# Patient Record
Sex: Male | Born: 1985 | Race: Black or African American | Hispanic: No | Marital: Single | Smoking: Former smoker
Health system: Southern US, Community
[De-identification: ages and names within clinical notes are randomized; demographics above are authoritative.]

---

## 2008-03-20 ENCOUNTER — Emergency Department (HOSPITAL_COMMUNITY): Admission: EM | Admit: 2008-03-20 | Discharge: 2008-03-20 | Payer: Self-pay | Admitting: Emergency Medicine

## 2008-11-17 ENCOUNTER — Emergency Department (HOSPITAL_COMMUNITY): Admission: EM | Admit: 2008-11-17 | Discharge: 2008-11-17 | Payer: Self-pay | Admitting: Emergency Medicine

## 2012-02-20 ENCOUNTER — Emergency Department (HOSPITAL_COMMUNITY): Payer: Self-pay

## 2012-02-20 ENCOUNTER — Encounter (HOSPITAL_COMMUNITY): Payer: Self-pay | Admitting: Emergency Medicine

## 2012-02-20 ENCOUNTER — Emergency Department (HOSPITAL_COMMUNITY)
Admission: EM | Admit: 2012-02-20 | Discharge: 2012-02-20 | Disposition: A | Discharge status: Home / Self Care | Payer: Self-pay | Attending: Emergency Medicine | Admitting: Emergency Medicine

## 2012-02-20 DIAGNOSIS — Y9389 Activity, other specified: Secondary | ICD-10-CM | POA: Insufficient documentation

## 2012-02-20 DIAGNOSIS — S025XXA Fracture of tooth (traumatic), initial encounter for closed fracture: Secondary | ICD-10-CM | POA: Insufficient documentation

## 2012-02-20 DIAGNOSIS — S01511A Laceration without foreign body of lip, initial encounter: Secondary | ICD-10-CM

## 2012-02-20 DIAGNOSIS — S01501A Unspecified open wound of lip, initial encounter: Secondary | ICD-10-CM | POA: Insufficient documentation

## 2012-02-20 DIAGNOSIS — Y998 Other external cause status: Secondary | ICD-10-CM | POA: Insufficient documentation

## 2012-02-20 MED ORDER — OXYCODONE-ACETAMINOPHEN 10-325 MG PO TABS: 1.0000 | ORAL_TABLET | ORAL | Status: AC | PRN

## 2012-02-20 MED ORDER — OXYCODONE-ACETAMINOPHEN 5-325 MG PO TABS
2.0000 | ORAL_TABLET | Freq: Once | ORAL | Status: AC
  Administered 2012-02-20: 2 via ORAL
  Filled 2012-02-20: qty 2

## 2012-02-20 MED ORDER — AMOXICILLIN-POT CLAVULANATE 875-125 MG PO TABS: 1.0000 | ORAL_TABLET | Freq: Two times a day (BID) | ORAL | Status: AC
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
## 2012-02-20 NOTE — ED Notes (Signed)
Pt c/o headache and states normally gets migraines. Pt requested drink of water.
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
## 2012-02-21 NOTE — ED Provider Notes (Signed)
Medical screening examination/treatment/procedure(s) were performed by non-physician practitioner and as supervising physician I was immediately available for consultation/collaboration.  Ziya Coonrod R. Xia Stohr, MD 02/21/12 0013 

## 2012-05-29 ENCOUNTER — Emergency Department (HOSPITAL_COMMUNITY)
Admission: EM | Admit: 2012-05-29 | Discharge: 2012-05-29 | Disposition: A | Discharge status: Home / Self Care | Payer: Self-pay | Attending: Emergency Medicine | Admitting: Emergency Medicine

## 2012-05-29 ENCOUNTER — Encounter (HOSPITAL_COMMUNITY): Payer: Self-pay | Admitting: Emergency Medicine

## 2012-05-29 DIAGNOSIS — F17200 Nicotine dependence, unspecified, uncomplicated: Secondary | ICD-10-CM | POA: Insufficient documentation

## 2012-05-29 DIAGNOSIS — L02419 Cutaneous abscess of limb, unspecified: Secondary | ICD-10-CM | POA: Insufficient documentation

## 2012-05-29 DIAGNOSIS — L02416 Cutaneous abscess of left lower limb: Secondary | ICD-10-CM

## 2012-05-29 NOTE — ED Notes (Signed)
Pt c/o 7/10 throbbing pain to left leg from upper shin to ankle when standing, onset 2 days ago. Pt A&Ox4, ambulatory, nad. Pt has a raised, dime-sized, red lump to left shin.

## 2012-05-29 NOTE — ED Notes (Signed)
Pt c/o left leg pain and possible abscess x 3 days

## 2012-05-29 NOTE — ED Provider Notes (Signed)
History    This chart was scribed for Gerhard Munch, MD, MD by Smitty Pluck, ED Scribe. The patient was seen in room TR05C and the patient's care was started at 2:09PM.   CSN: [id number]  Arrival date & time 05/29/12  1254      Chief Complaint  Patient presents with  . Abscess  . Leg Pain    (Consider location/radiation/quality/duration/timing/severity/associated sxs/prior treatment) The history is provided by the patient. No language interpreter was used.   Douglas Ali is a 26 y.o. male who presents to the Emergency Department complaining of constant, moderate leg pain due to abscess on leg onset 2 days ago. Pt has used cream on legs with minor relief. Pt denies fever, chills, vomiting, nausea, cough and any other pain.   History reviewed. No pertinent past medical history.  History reviewed. No pertinent past surgical history.  History reviewed. No pertinent family history.  History  Substance Use Topics  . Smoking status: Current Every Day Smoker  . Smokeless tobacco: Not on file  . Alcohol Use: No      Review of Systems  Constitutional:       Per HPI, otherwise negative  HENT:       Per HPI, otherwise negative  Eyes: Negative.   Respiratory:       Per HPI, otherwise negative  Cardiovascular:       Per HPI, otherwise negative  Gastrointestinal: Negative for vomiting.  Genitourinary: Negative.   Musculoskeletal:       Per HPI, otherwise negative  Skin: Negative.   Neurological: Negative for syncope.    Allergies  Review of patient's allergies indicates no known allergies.  Home Medications  No current outpatient prescriptions on file.  BP 129/58  Pulse 68  Temp 97.7 F (36.5 C) (Oral)  Resp 21  Ht 5\' 7"  (1.702 m)  Wt 192 lb (87.091 kg)  BMI 30.07 kg/m2  SpO2 97%  Physical Exam  Nursing note and vitals reviewed. Constitutional: He is oriented to person, place, and time. He appears well-developed. No distress.  HENT:  Head: Normocephalic  and atraumatic.  Eyes: Conjunctivae normal and EOM are normal.  Cardiovascular: Normal rate, regular rhythm and normal heart sounds.   Pulmonary/Chest: Effort normal and breath sounds normal. No stridor. No respiratory distress. He has no wheezes.  Abdominal: He exhibits no distension.  Musculoskeletal: He exhibits no edema.  Neurological: He is alert and oriented to person, place, and time.       Neurologically intact of distal extremities    Skin: Skin is warm and dry.       Indurated area on lateral mid calf 1.5 cm  Psychiatric: He has a normal mood and affect.    ED Course  Procedures (including critical care time) DIAGNOSTIC STUDIES:   COORDINATION OF CARE: 2:11 PM Discussed ED treatment with pt     Labs Reviewed - No data to display No results found.   No diagnosis found.    MDM  I personally performed the services described in this documentation, which was scribed in my presence. The recorded information has been reviewed and is accurate.   This generally well-appearing male presents with concerns over a leg wound.  Though there appears to have been an abscess, the wound is well healing and appearance, with no surrounding erythema.  The patient was also afebrile, not tachycardic.  He was discharged in stable condition with return precautions, wound care instructions.    Gerhard Munch, MD 05/29/12 [phone]

## 2012-06-18 ENCOUNTER — Emergency Department (HOSPITAL_COMMUNITY)
Admission: EM | Admit: 2012-06-18 | Discharge: 2012-06-18 | Disposition: A | Discharge status: Home / Self Care | Payer: Self-pay | Attending: Emergency Medicine | Admitting: Emergency Medicine

## 2012-06-18 DIAGNOSIS — I808 Phlebitis and thrombophlebitis of other sites: Secondary | ICD-10-CM | POA: Insufficient documentation

## 2012-06-18 DIAGNOSIS — I809 Phlebitis and thrombophlebitis of unspecified site: Secondary | ICD-10-CM

## 2012-06-18 DIAGNOSIS — F17200 Nicotine dependence, unspecified, uncomplicated: Secondary | ICD-10-CM | POA: Insufficient documentation
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
## 2012-06-18 NOTE — ED Notes (Signed)
Was giving plasma today and noticed his rt arm was swollen  So they finished it in his left arm rt upper arm swollen  States hurts to bend arm

## 2012-06-18 NOTE — ED Notes (Signed)
Pt states he gives every week warned that he may get infection or blood clot if he cont to do this

## 2012-06-20 ENCOUNTER — Emergency Department (HOSPITAL_COMMUNITY)
Admission: EM | Admit: 2012-06-20 | Discharge: 2012-06-20 | Disposition: A | Discharge status: Home / Self Care | Payer: Self-pay | Attending: Emergency Medicine | Admitting: Emergency Medicine

## 2012-06-20 ENCOUNTER — Encounter (HOSPITAL_COMMUNITY): Payer: Self-pay | Admitting: Adult Health

## 2012-06-20 DIAGNOSIS — F17200 Nicotine dependence, unspecified, uncomplicated: Secondary | ICD-10-CM | POA: Insufficient documentation

## 2012-06-20 DIAGNOSIS — IMO0002 Reserved for concepts with insufficient information to code with codable children: Secondary | ICD-10-CM | POA: Insufficient documentation

## 2012-06-20 DIAGNOSIS — Y849 Medical procedure, unspecified as the cause of abnormal reaction of the patient, or of later complication, without mention of misadventure at the time of the procedure: Secondary | ICD-10-CM | POA: Insufficient documentation

## 2012-06-20 DIAGNOSIS — T148XXA Other injury of unspecified body region, initial encounter: Secondary | ICD-10-CM

## 2012-06-20 NOTE — ED Provider Notes (Signed)
Medical screening examination/treatment/procedure(s) were performed by non-physician practitioner and as supervising physician I was immediately available for consultation/collaboration.   Dione Booze, MD 06/20/12 [phone]

## 2012-06-20 NOTE — ED Notes (Addendum)
Presents with right upper arm redness post donating plasma Monday. Redness, edema and warmth extend from axilla to elbow.  Pain is rated 5/10. CMS intact.

## 2012-06-20 NOTE — ED Provider Notes (Signed)
History     CSN: [id number]  Arrival date & time 06/20/12  1722   First MD Initiated Contact with Patient 06/20/12 1754      Chief Complaint  Patient presents with  . Arm Pain    (Consider location/radiation/quality/duration/timing/severity/associated sxs/prior treatment) Patient is a 26 y.o. male presenting with arm pain. The history is provided by the patient.  Arm Pain This is a new problem. Pertinent negatives include no fever or nausea. Associated symptoms comments: He donated plasma 2 days ago and now has a large area of discoloration extending from the IV access site to axilla of right arm. Minimal pain or discomfort. No fever.Marland Kitchen    History reviewed. No pertinent past medical history.  History reviewed. No pertinent past surgical history.  History reviewed. No pertinent family history.  History  Substance Use Topics  . Smoking status: Current Every Day Smoker  . Smokeless tobacco: Not on file  . Alcohol Use: No      Review of Systems  Constitutional: Negative for fever.  Gastrointestinal: Negative for nausea.  Musculoskeletal:       See HPI.  Skin:       See HPI.    Allergies  Review of patient's allergies indicates no known allergies.  Home Medications  No current outpatient prescriptions on file.  BP 120/73  Pulse 97  Temp 98.5 F (36.9 C) (Oral)  Resp 16  SpO2 97%  Physical Exam  Constitutional: He is oriented to person, place, and time. He appears well-developed and well-nourished.  Neck: Normal range of motion.  Cardiovascular:       Pulses in distal extremity 2+.  Pulmonary/Chest: Effort normal.  Musculoskeletal: Normal range of motion.       Discoloration to right upper arm from [facility] to axilla on volar and lateral aspects c/w hematoma. Minimally tender. FROM with full strength. Biceps soft, no rigidity.  Neurological: He is alert and oriented to person, place, and time.  Skin: Skin is warm and dry.  Psychiatric: He has a normal mood and  affect.    ED Course  Procedures (including critical care time)  Labs Reviewed - No data to display No results found.   No diagnosis found.  1. Ecchymosis   MDM  No warmth or tenderness - doubt infection. Discoloration is mixed erythema and bruising in various stages.         Rodena Medin, PA-C 06/20/12 1934

## 2012-09-26 ENCOUNTER — Emergency Department (HOSPITAL_COMMUNITY): Payer: Self-pay

## 2012-09-26 ENCOUNTER — Emergency Department (HOSPITAL_COMMUNITY)
Admission: EM | Admit: 2012-09-26 | Discharge: 2012-09-26 | Disposition: A | Discharge status: Home / Self Care | Payer: Self-pay | Attending: Emergency Medicine | Admitting: Emergency Medicine

## 2012-09-26 ENCOUNTER — Encounter (HOSPITAL_COMMUNITY): Payer: Self-pay | Admitting: Emergency Medicine

## 2012-09-26 DIAGNOSIS — R112 Nausea with vomiting, unspecified: Secondary | ICD-10-CM | POA: Insufficient documentation

## 2012-09-26 DIAGNOSIS — R197 Diarrhea, unspecified: Secondary | ICD-10-CM | POA: Insufficient documentation

## 2012-09-26 DIAGNOSIS — F17200 Nicotine dependence, unspecified, uncomplicated: Secondary | ICD-10-CM | POA: Insufficient documentation

## 2012-09-26 LAB — CBC WITH DIFFERENTIAL/PLATELET
Basophils Absolute: 0 10*3/uL (ref 0.0–0.1)
Basophils Relative: 0 % (ref 0–1)
Eosinophils Absolute: 0.1 10*3/uL (ref 0.0–0.7)
HCT: 42.3 % (ref 39.0–52.0)
MCH: 27.5 pg (ref 26.0–34.0)
MCHC: 35.7 g/dL (ref 30.0–36.0)
Monocytes Absolute: 0.9 10*3/uL (ref 0.1–1.0)
Monocytes Relative: 7 % (ref 3–12)
Neutro Abs: 10.5 10*3/uL — ABNORMAL HIGH (ref 1.7–7.7)
Neutrophils Relative %: 84 % — ABNORMAL HIGH (ref 43–77)
RDW: 13.7 % (ref 11.5–15.5)

## 2012-09-26 LAB — COMPREHENSIVE METABOLIC PANEL
AST: 25 U/L (ref 0–37)
Albumin: 4.2 g/dL (ref 3.5–5.2)
BUN: 13 mg/dL (ref 6–23)
Calcium: 9.6 mg/dL (ref 8.4–10.5)
Chloride: 104 mEq/L (ref 96–112)
Creatinine, Ser: 1.12 mg/dL (ref 0.50–1.35)
Total Bilirubin: 0.5 mg/dL (ref 0.3–1.2)
Total Protein: 7.4 g/dL (ref 6.0–8.3)

## 2012-09-26 LAB — LIPASE, BLOOD: Lipase: 21 U/L (ref 11–59)

## 2012-09-26 MED ORDER — DICYCLOMINE HCL 10 MG PO CAPS
10.0000 mg | ORAL_CAPSULE | Freq: Once | ORAL | Status: AC
  Administered 2012-09-26: 10 mg via ORAL
  Filled 2012-09-26: qty 1

## 2012-09-26 MED ORDER — SODIUM CHLORIDE 0.9 % IV BOLUS (SEPSIS)
1000.0000 mL | Freq: Once | INTRAVENOUS | Status: AC
  Administered 2012-09-26: 1000 mL via INTRAVENOUS

## 2012-09-26 MED ORDER — ONDANSETRON HCL 4 MG/2ML IJ SOLN
4.0000 mg | Freq: Once | INTRAMUSCULAR | Status: AC
  Administered 2012-09-26: 4 mg via INTRAVENOUS
  Filled 2012-09-26: qty 2

## 2012-09-26 MED ORDER — PROMETHAZINE HCL 12.5 MG PO TABS: 12.5000 mg | ORAL_TABLET | Freq: Four times a day (QID) | ORAL | Status: DC | PRN

## 2012-09-26 NOTE — ED Notes (Signed)
Pt presented to ED with abdominal pain.As per pt the pain started last night and feels like a squeezy pain.

## 2012-09-26 NOTE — ED Provider Notes (Signed)
History     CSN: [id number]  Arrival date & time 09/26/12  6295   First MD Initiated Contact with Patient 09/26/12 [phone]      Chief Complaint  Patient presents with  . Abdominal Pain    (Consider location/radiation/quality/duration/timing/severity/associated sxs/prior treatment) HPI Zacharia Sowles is a 27 y.o. male who presents to ED with complaint of nausea, vomiting, abdominal pain, diarrhea, onset about 6hrs ago. States vomited about 4-5 times, mostly stomach contents. States several episodes of watery diarrhea. Abdominal pain is diffuse, crampy. States only hurts right after vomiting. Denies blood in stool or emesis. States abdomen feels more swollen than usual. Denies prior abdominal problems or surgeries. No alcohol or drugs. Did not take any medications prior to coming in.   History reviewed. No pertinent past medical history.  No past surgical history on file.  No family history on file.  History  Substance Use Topics  . Smoking status: Current Every Day Smoker  . Smokeless tobacco: Not on file  . Alcohol Use: No      Review of Systems  Constitutional: Negative for fever and chills.  Gastrointestinal: Positive for nausea, vomiting, abdominal pain and diarrhea. Negative for blood in stool.  Genitourinary: Negative for dysuria and flank pain.  All other systems reviewed and are negative.    Allergies  Review of patient's allergies indicates no known allergies.  Home Medications  No current outpatient prescriptions on file.  BP 118/63  Pulse 72  Temp(Src) 98.4 F (36.9 C) (Oral)  Resp 16  SpO2 99%  Physical Exam  Nursing note and vitals reviewed. Constitutional: He appears well-developed and well-nourished. No distress.  HENT:  Head: Normocephalic.  Eyes: Conjunctivae are normal.  Neck: Neck supple.  Cardiovascular: Normal rate, regular rhythm and normal heart sounds.   Pulmonary/Chest: Effort normal and breath sounds normal. No respiratory distress.  He has no wheezes. He has no rales.  Abdominal: Soft. Bowel sounds are normal. He exhibits distension. He exhibits no mass. There is tenderness. There is no rebound and no guarding.  Pt's abdomen appears distended, not sure if just body habitus. Diffuse mild tenderness in all quadrants  Musculoskeletal: He exhibits no edema.  Neurological: He is alert.  Skin: Skin is warm and dry.    ED Course  Procedures (including critical care time)  Results for orders placed during the hospital encounter of 09/26/12  CBC WITH DIFFERENTIAL      Result Value Range   WBC 12.5 (*) 4.0 - 10.5 K/uL   RBC 5.49  4.22 - 5.81 MIL/uL   Hemoglobin 15.1  13.0 - 17.0 g/dL   HCT 32.4  40.1 - 02.7 %   MCV 77.0 (*) 78.0 - 100.0 fL   MCH 27.5  26.0 - 34.0 pg   MCHC 35.7  30.0 - 36.0 g/dL   RDW 25.3  66.4 - 40.3 %   Platelets 223  150 - 400 K/uL   Neutrophils Relative 84 (*) 43 - 77 %   Neutro Abs 10.5 (*) 1.7 - 7.7 K/uL   Lymphocytes Relative 8 (*) 12 - 46 %   Lymphs Abs 1.0  0.7 - 4.0 K/uL   Monocytes Relative 7  3 - 12 %   Monocytes Absolute 0.9  0.1 - 1.0 K/uL   Eosinophils Relative 1  0 - 5 %   Eosinophils Absolute 0.1  0.0 - 0.7 K/uL   Basophils Relative 0  0 - 1 %   Basophils Absolute 0.0  0.0 - 0.1 K/uL  COMPREHENSIVE METABOLIC PANEL      Result Value Range   Sodium 141  135 - 145 mEq/L   Potassium 4.1  3.5 - 5.1 mEq/L   Chloride 104  96 - 112 mEq/L   CO2 26  19 - 32 mEq/L   Glucose, Bld 106 (*) 70 - 99 mg/dL   BUN 13  6 - 23 mg/dL   Creatinine, Ser 1.61  0.50 - 1.35 mg/dL   Calcium 9.6  8.4 - 09.6 mg/dL   Total Protein 7.4  6.0 - 8.3 g/dL   Albumin 4.2  3.5 - 5.2 g/dL   AST 25  0 - 37 U/L   ALT 16  0 - 53 U/L   Alkaline Phosphatase 99  39 - 117 U/L   Total Bilirubin 0.5  0.3 - 1.2 mg/dL   GFR calc non Af Amer >90  >90 mL/min   GFR calc Af Amer >90  >90 mL/min  LIPASE, BLOOD      Result Value Range   Lipase 21  11 - 59 U/L   Dg Abd Acute W/chest  09/26/2012  *RADIOLOGY REPORT*   Clinical Data: Abdominal pain with nausea, vomiting and diarrhea.  ACUTE ABDOMEN SERIES (ABDOMEN 2 VIEW & CHEST 1 VIEW)  Comparison: None.  Findings: Frontal view of the chest shows midline trachea and normal heart size.  Lungs are clear.  No pleural fluid.  Two views of the abdomen show gas and air-fluid levels in minimally distended loops of small bowel in the central abdomen, as well as within nondistended colon.  IMPRESSION: Bowel gas pattern can be seen with gastroenteritis.  No definite obstruction.   Original Report Authenticated By: Leanna Battles, M.D.       1. Nausea vomiting and diarrhea       MDM  Pt with nausea vomiting, diarrhea onset this morning. Labs all unremarkable except for WBC of 12.5. He was given zofran 4mg  IV, bentyl PO, fluids. Pt feeling much better. Abdomen reassessed. Non tender. He was able to tolerate bentyl and PO fluids. D/c home with anti emetics, follow up with PCP.    Filed Vitals:   09/26/12 0600 09/26/12 0615 09/26/12 0630  BP: 118/63 117/72 121/76  Pulse: 72 66 63  Temp: 98.4 F (36.9 C)    TempSrc: Oral    Resp: 16    SpO2: 99% 99% 99%        Lottie Mussel, PA-C 09/26/12 0850

## 2012-09-27 NOTE — ED Provider Notes (Signed)
Medical screening examination/treatment/procedure(s) were performed by non-physician practitioner and as supervising physician I was immediately available for consultation/collaboration.   Lyanne Co, MD 09/27/12 [phone]

## 2013-05-17 ENCOUNTER — Ambulatory Visit: Payer: Self-pay

## 2014-02-11 ENCOUNTER — Encounter (HOSPITAL_COMMUNITY): Payer: Self-pay | Admitting: Emergency Medicine

## 2014-02-11 DIAGNOSIS — R109 Unspecified abdominal pain: Secondary | ICD-10-CM | POA: Insufficient documentation

## 2014-02-11 DIAGNOSIS — F17200 Nicotine dependence, unspecified, uncomplicated: Secondary | ICD-10-CM | POA: Insufficient documentation

## 2014-02-11 LAB — CBC WITH DIFFERENTIAL/PLATELET
BASOS PCT: 0 % (ref 0–1)
Basophils Absolute: 0 10*3/uL (ref 0.0–0.1)
EOS ABS: 0.2 10*3/uL (ref 0.0–0.7)
Eosinophils Relative: 2 % (ref 0–5)
HCT: 42.8 % (ref 39.0–52.0)
Hemoglobin: 14.8 g/dL (ref 13.0–17.0)
Lymphocytes Relative: 18 % (ref 12–46)
Lymphs Abs: 2.1 10*3/uL (ref 0.7–4.0)
MCH: 27.6 pg (ref 26.0–34.0)
MCHC: 34.6 g/dL (ref 30.0–36.0)
MCV: 79.7 fL (ref 78.0–100.0)
Monocytes Absolute: 0.9 10*3/uL (ref 0.1–1.0)
Monocytes Relative: 8 % (ref 3–12)
NEUTROS PCT: 72 % (ref 43–77)
Neutro Abs: 8.4 10*3/uL — ABNORMAL HIGH (ref 1.7–7.7)
PLATELETS: 231 10*3/uL (ref 150–400)
RBC: 5.37 MIL/uL (ref 4.22–5.81)
RDW: 13.9 % (ref 11.5–15.5)
WBC: 11.7 10*3/uL — ABNORMAL HIGH (ref 4.0–10.5)

## 2014-02-11 LAB — COMPREHENSIVE METABOLIC PANEL
ALBUMIN: 3.6 g/dL (ref 3.5–5.2)
ALK PHOS: 84 U/L (ref 39–117)
ALT: 11 U/L (ref 0–53)
ANION GAP: 9 (ref 5–15)
AST: 19 U/L (ref 0–37)
BUN: 11 mg/dL (ref 6–23)
CO2: 28 mEq/L (ref 19–32)
Calcium: 9.3 mg/dL (ref 8.4–10.5)
Chloride: 102 mEq/L (ref 96–112)
Creatinine, Ser: 1.05 mg/dL (ref 0.50–1.35)
GFR calc Af Amer: >90 mL/min (ref >90)
GFR calc non Af Amer: >90 mL/min (ref >90)
Glucose, Bld: 97 mg/dL (ref 70–99)
POTASSIUM: 4.6 meq/L (ref 3.7–5.3)
SODIUM: 139 meq/L (ref 137–147)
TOTAL PROTEIN: 6.3 g/dL (ref 6.0–8.3)
Total Bilirubin: 0.3 mg/dL (ref 0.3–1.2)

## 2014-02-11 LAB — LIPASE, BLOOD: Lipase: 26 U/L (ref 11–59)

## 2014-02-11 NOTE — ED Notes (Signed)
Vitals charted in error  

## 2014-02-11 NOTE — ED Notes (Signed)
PT reports sharp pains in abdomen when having BM; mucous and blood in commode. Denies nausea and vomiting. States that he feels lightheaded. States bleeding was minimal; on tissue. States it did not look like normal stool; "pieces of blood and clear". Has had episode of this before; was put on antibiotic for it but cannot recall why or what it was.

## 2014-02-12 ENCOUNTER — Emergency Department (HOSPITAL_COMMUNITY)
Admission: EM | Admit: 2014-02-12 | Discharge: 2014-02-12 | Discharge status: Against Medical Advice | Payer: Self-pay | Attending: Emergency Medicine | Admitting: Emergency Medicine

## 2015-06-23 ENCOUNTER — Encounter (HOSPITAL_COMMUNITY): Payer: Self-pay | Admitting: Emergency Medicine

## 2015-06-23 ENCOUNTER — Emergency Department (HOSPITAL_COMMUNITY)
Admission: EM | Admit: 2015-06-23 | Discharge: 2015-06-24 | Disposition: A | Discharge status: Home / Self Care | Payer: Self-pay | Attending: Emergency Medicine | Admitting: Emergency Medicine

## 2015-06-23 DIAGNOSIS — R300 Dysuria: Secondary | ICD-10-CM | POA: Insufficient documentation

## 2015-06-23 DIAGNOSIS — F17200 Nicotine dependence, unspecified, uncomplicated: Secondary | ICD-10-CM | POA: Insufficient documentation

## 2015-06-23 LAB — URINE MICROSCOPIC-ADD ON

## 2015-06-23 LAB — URINALYSIS, ROUTINE W REFLEX MICROSCOPIC
BILIRUBIN URINE: NEGATIVE
Glucose, UA: NEGATIVE mg/dL
KETONES UR: NEGATIVE mg/dL
Leukocytes, UA: NEGATIVE
NITRITE: NEGATIVE
Protein, ur: NEGATIVE mg/dL
Specific Gravity, Urine: 1.016 (ref 1.005–1.030)
pH: 5 (ref 5.0–8.0)

## 2015-06-23 NOTE — ED Notes (Signed)
Pt. reports dysuria onset today , denies hematuria , no fever or chills.

## 2015-06-24 NOTE — Discharge Instructions (Signed)
Do not hesitate to return to the emergency room for any new, worsening or concerning symptoms.  Please obtain primary care using resource guide below. Let them know that you were seen in the emergency room and that they will need to obtain records for further outpatient management.    Dysuria Dysuria is pain or discomfort while urinating. The pain or discomfort may be felt in the tube that carries urine out of the bladder (urethra) or in the surrounding tissue of the genitals. The pain may also be felt in the groin area, lower abdomen, and lower back. You may have to urinate frequently or have the sudden feeling that you have to urinate (urgency). Dysuria can affect both men and women, but is more common in women. Dysuria can be caused by many different things, including:  Urinary tract infection in women.  Infection of the kidney or bladder.  Kidney stones or bladder stones.  Certain sexually transmitted infections (STIs), such as chlamydia.  Dehydration.  Inflammation of the vagina.  Use of certain medicines.  Use of certain soaps or scented products that cause irritation. HOME CARE INSTRUCTIONS Watch your dysuria for any changes. The following actions may help to reduce any discomfort you are feeling:  Drink enough fluid to keep your urine clear or pale yellow.  Empty your bladder often. Avoid holding urine for long periods of time.  After a bowel movement or urination, women should cleanse from front to back, using each tissue only once.  Empty your bladder after sexual intercourse.  Take medicines only as directed by your health care provider.  If you were prescribed an antibiotic medicine, finish it all even if you start to feel better.  Avoid caffeine, tea, and alcohol. They can irritate the bladder and make dysuria worse. In men, alcohol may irritate the prostate.  Keep all follow-up visits as directed by your health care provider. This is important.  If you had  any tests done to find the cause of dysuria, it is your responsibility to obtain your test results. Ask the lab or department performing the test when and how you will get your results. Talk with your health care provider if you have any questions about your results. SEEK MEDICAL CARE IF:  You develop pain in your back or sides.  You have a fever.  You have nausea or vomiting.  You have blood in your urine.  You are not urinating as often as you usually do. SEEK IMMEDIATE MEDICAL CARE IF:  You pain is severe and not relieved with medicines.  You are unable to hold down any fluids.  You or someone else notices a change in your mental function.  You have a rapid heartbeat at rest.  You have shaking or chills.  You feel extremely weak.   This information is not intended to replace advice given to you by your health care provider. Make sure you discuss any questions you have with your health care provider.   Document Released: 03/11/2004 Document Revised: 07/04/2014 Document Reviewed: 02/06/2014 Elsevier Interactive Patient Education 2016 [employer]  Emergency Department Resource Guide 1) Find a Doctor and Pay Out of Pocket Although you won't have to find out who is covered by your insurance plan, it is a good idea to ask around and get recommendations. You will then need to call the office and see if the doctor you have chosen will accept you as a new patient and what types of options they offer for patients who are  self-pay. Some doctors offer discounts or will set up payment plans for their patients who do not have insurance, but you will need to ask so you aren't surprised when you get to your appointment.  2) Contact Your Local Health Department Not all health departments have doctors that can see patients for sick visits, but many do, so it is worth a call to see if yours does. If you don't know where your local health department is, you can check in your phone book. The CDC  also has a tool to help you locate your state's health department, and many state websites also have listings of all of their local health departments.  3) Find a Walk-in Clinic If your illness is not likely to be very severe or complicated, you may want to try a walk in clinic. These are popping up all over the country in pharmacies, drugstores, and shopping centers. They're usually staffed by nurse practitioners or physician assistants that have been trained to treat common illnesses and complaints. They're usually fairly quick and inexpensive. However, if you have serious medical issues or chronic medical problems, these are probably not your best option.  No Primary Care Doctor: - Call Health Connect at  [phone] - they can help you locate a primary care doctor that  accepts your insurance, provides certain services, etc. - Physician Referral Service- [phone]  Chronic Pain Problems: Organization         Address  Phone   Notes  [facility]  [phone] Patients need to be referred by their primary care doctor.   Medication Assistance: Organization         Address  Phone   Notes  [facility] Medication [facility] [address] [place] [place], [place] [address] [phone] --Must be a resident of [facility] -- Must have NO insurance coverage whatsoever (no Medicaid/ Medicare, etc.) -- The pt. MUST have a primary care doctor that directs their care regularly and follows them in the community   [facility]  [phone]   [employer]  [phone]    Agencies that provide inexpensive medical care: Organization         Address  Phone   Notes  [facility]  [phone]   [facility]    [phone]   [facility] [address] [place], [place] [address] [phone]   [facility] [address] [place], [place] [phone]   [facility]anned [facility]    [phone]   [facility]    [phone]   [facility]  [address], [place] Phone:  [phone], Fax:  [phone] Hours of Operation:  9 am - 6 pm, M-F.  Also accepts Medicaid/Medicare and self-pay.  [facility]  [address], [place] Phone: [phone], Fax: [phone]. Hours of Operation:  8:30 am - 5:30 pm, M-F.  Also accepts Medicaid and self-pay.  [facility] [place] [address], [place] Phone: [phone]   [facility] [address] Natasha Bence [place], [place] [phone], Ext. [phone] Mondays & Thursdays: 7-9 AM.  First 15 patients are seen on a first come, first serve basis.    Medicaid-accepting [facility] Providers:  Organization         Address  Phone   Notes  [facility] [address], Ervin Knack, [place] [phone] Also accepts self-pay patients.  [facility] [address] Laurell Josephs [place], [place]  [phone]   [facility] [address], [place] [phone]   [facility] [address], [place] [phone]   Renaye Rakers [address], [place]   [phone] Only accepts [place] Access [place] patients after they have their name applied to their card.   Self-Pay (no insurance) in [facility]:  Organization         Address  Phone   Notes  Sickle Cell Patients, [facility] [address] [place], [place] [phone]   [facility] [address] [place], [place] [phone]   [facility] [place]  [address],  [phone]   [facility]/Dr. Osei-Bonsu  [address], [place] or [address], [place] [phone] Phone number for both [place] and [place] locations is the same.  [facility] [address], [place] [phone]   [facility] [address], [place] or [address] [phone]   [facility] [address], [place] [phone], phone; [phone], fax Sees patients 1st and 3rd Saturday of every month.  Must not qualify for public or private insurance (i.e. Medicaid, Medicare, [place] Health Choice, Veterans' Benefits)  Household income should be no more than 200% of the poverty level The clinic cannot treat you if you are pregnant or think you are pregnant  Sexually transmitted diseases are not treated at the clinic.    Dental Care: Organization         Address  Phone  Notes  [facility] Department of [facility] [address] [place], [place] [phone] Accepts children up to age 57 who are enrolled in [place] or [place] Health Choice; pregnant women with a Medicaid card; and children who have applied for Medicaid or [place] Health Choice, but were declined, whose parents can pay a reduced fee at time of service.  [facility] Department of [facility]  [address], [place] [phone] Accepts children up to age 66 who are enrolled in [place] or [place] Health Choice; pregnant women with a Medicaid card; and children who have applied for Medicaid or [place] Health Choice, but were declined, whose parents can pay a reduced fee at time of service.  [facility]  [address] [place], [place] [phone] Patients are seen by appointment only. Walk-ins are not accepted. [facility] will see patients 7 years of age and older. Monday - Tuesday (8am-5pm) Most Wednesdays (8:30-5pm) $30 per visit, cash only  [facility] Adult [facility]  [address], [facility] [phone] Patients are seen by appointment only. Walk-ins are not accepted. [facility] will see patients 11 years of age and older. One  Wednesday Evening (Monthly: Volunteer Based).  $30 per visit, cash only  [employer]  [phone] for adults; Children under age 74, call Graduate Pediatric Dentistry at [phone]. Children aged 36-14, please call [phone] to request a pediatric application.  Dental services are provided in all areas of dental care including fillings, crowns and bridges, complete and partial dentures, implants, gum treatment, root canals, and  extractions. Preventive care is also provided. Treatment is provided to both adults and children. Patients are selected via a lottery and there is often a waiting list.   [facility] [address] [place]  [phone] [email or web address]   [facility] [address] [place], [place] [phone], Ext. [phone] Second and Fourth Thursday of each month, opens at 6:30 AM; Clinic ends at 9 AM.  Patients are seen on a first-come first-served basis, and a limited number are seen during each clinic.   [facility]  [address] Ether Griffins [place], [place] [phone]   Eligibility Requirements You must have lived in [place], [place], or [place] counties for at least the last three months.   You cannot be eligible for state or federal sponsored [place], including [employer], [place], or [employer].   You generally cannot be eligible for healthcare insurance through your employer.    How to apply: Eligibility screenings are held every Tuesday and Wednesday afternoon from 1:00 pm until 4:00 pm. You do not need an appointment for the interview!  [facility] [address] [place], [place] [phone]   [facility] Health Department  [phone]   [facility] Health Department  [phone]   [facility] Health Department  [phone]    Behavioral Health Resources in the Community: Intensive Outpatient Programs Organization          Address  Phone  Notes  [facility] [address] [place], [place] [phone]   [facility] Outpatient [address] [place], [place] [phone]   [facility] [address], [place], [place]  [phone]   [facility] Mental Health [address],  [place], [place] [phone] or [phone]   Substance Abuse Resources Organization         Address  Phone  Notes  [facility]  [phone]   [facility]  [phone]   [facility]  [phone]   Floydene Flock  [phone]   Residential & Outpatient Substance Abuse Program  [phone]   Psychological Services Organization         Address  Phone  Notes  [facility] Behavioral Health  [phone]   [facility] [address] Services  [phone]   [facility] Mental Health [address] [place] [phone] or [phone]    Mobile Crisis Teams Organization         Address  Phone  Notes  [facility], Mobile Crisis Care Unit  [phone]   [facility]  [address] [place], [place] [phone]   Doristine Locks [address] [place] [phone]    Self-Help/Support Groups Organization         Address  Phone             Notes  [facility] [place] - variety of support groups  336- I7437963 Call for more information  Narcotics Anonymous (NA), [facility] [address], [employer] [place]  2 meetings at this location   Statistician         Address  Phone  Notes  [facility] [address],    [place]  [phone]   [facility]  [address] [place] [address], [place], [place] [phone]   [facility] [address] [place], [place] [phone] Admissions: 8am-3pm M-F  [facility] [address]    [place], [place] [phone]   [facility] [address] [place], [place], [place] [phone]   [facility] [address],  [place], [place] [phone]   [facility] - Intensive Outpatient [address] Laurell Josephs [address], [place], [place] [phone]   [facility] (Addiction Recovery [facility] [address] [place]  [place], [place] [phone] or [phone]   [facility] ([facility]) [address], [place], [place] [phone] Accepts Medicaid  [facility] [place] [address],  [place] [phone] Substance Abuse/Addiction Treatment   [facility] Organization         Address  Phone  Notes  [facility]  [phone]   Angie Fava, PhD [address] Ervin Knack [place], [place]   6108336857 or [phone]   [facility] Behavioral   [address] [place], [place] [phone]   [facility] [address] [place], [place] [phone] Insurance/Medicaid/sponsorship through [facility] and Families [address]                                    [place], [place] [phone] Therapy/tele-psych/case  [facility] [address] [place], [place] [phone]    Dr. Lolly Mustache  [phone]   [facility]  [employer] [facility] Dept. 1) [address] [place] 2) [address], [place] 3)  [address], [place] [phone]  [phone]   [facility] Child Abuse Hotline [phone] or 6094440679 (After Hours)

## 2015-06-24 NOTE — ED Provider Notes (Deleted)
Blood pressure 135/74, pulse 85, temperature 98.2 F (36.8 C), temperature source Oral, resp. rate 16, height 5\' 7"  (1.702 m), weight 90.719 kg, SpO2 96 %.  Douglas OfficerBenjamin Ali is a 29 y.o. male complaining of dysuria. LWBS after triage. I did not participate in the care of this patient.   Wynetta Emeryicole Treanna Dumler, PA-C 06/24/15 0010

## 2015-06-24 NOTE — ED Provider Notes (Signed)
CSN: [id number]     Arrival date & time 06/23/15  2312 History   None    Chief Complaint  Patient presents with  . Dysuria     (Consider location/radiation/quality/duration/timing/severity/associated sxs/prior Treatment) HPI   Blood pressure 135/74, pulse 85, temperature 98.2 F (36.8 C), temperature source Oral, resp. rate 16, height  (1.702 m), weight 90.719 kg, SpO2 96 %.  Douglas Ali is a 29 y.o. male complaining of burning sensation during urination onset today. There is been no urethral discharge, no rashes, no testicular pain swelling, fever, chills, abdominal pain. Patient states that he is monogamous with his fiance, no new sexual partners recently. States he is not concerned about STDs. He has no history of urinary tract infections. He states that his fiance does have a urinary tract infection.   History reviewed. No pertinent past medical history. History reviewed. No pertinent past surgical history. No family history on file. Social History  Substance Use Topics  . Smoking status: Current Every Day Smoker  . Smokeless tobacco: None  . Alcohol Use: No    Review of Systems  10 systems reviewed and found to be negative, except as noted in the HPI.  Allergies  Review of patient's allergies indicates no known allergies.  Home Medications   Prior to Admission medications   Not on File   BP 135/74 mmHg  Pulse 85  Temp(Src) 98.2 F (36.8 C) (Oral)  Resp 16  Ht  (1.702 m)  Wt 90.719 kg  BMI 31.32 kg/m2  SpO2 96% Physical Exam  Constitutional: He is oriented to person, place, and time. He appears well-developed and well-nourished. No distress.  HENT:  Head: Normocephalic.  Eyes: Conjunctivae and EOM are normal.  Cardiovascular: Normal rate.   Pulmonary/Chest: Effort normal and breath sounds normal. No stridor.  Abdominal: Soft. Bowel sounds are normal.  Musculoskeletal: Normal range of motion.  Neurological: He is alert and oriented to  person, place, and time.  Psychiatric: He has a normal mood and affect.  Nursing note and vitals reviewed.   ED Course  Procedures (including critical care time) Labs Review Labs Reviewed  URINALYSIS, ROUTINE W REFLEX MICROSCOPIC (NOT AT [facility]) - Abnormal; Notable for the following:    APPearance CLOUDY (*)    Hgb urine dipstick TRACE (*)    All other components within normal limits  URINE MICROSCOPIC-ADD ON - Abnormal; Notable for the following:    Squamous Epithelial / LPF 0-5 (*)    Bacteria, UA RARE (*)    All other components within normal limits  URINE CULTURE  GC/CHLAMYDIA PROBE AMP ([place]) NOT AT [facility]    Imaging Review No results found. I have personally reviewed and evaluated these images and lab results as part of my medical decision-making.   EKG Interpretation None      MDM   Final diagnoses:  Dysuria   Filed Vitals:   06/23/15 2317  BP: 135/74  Pulse: 85  Temp: 98.2 F (36.8 C)  TempSrc: Oral  Resp: 16  Height:  (1.702 m)  Weight: 90.719 kg  SpO2: 96%    Douglas Ali is 29 y.o. male presenting with dysuria onset today. No prior history of UTIs or anatomic abnormalities GU system. UA not consistent with infection. Patient states he is monogamous with his fiance, he is not concerned about STDs. Will test urine for gonorrhea and chlamydia and culture urine. Advise close return to ED if he has new or worsening symptoms.  Evaluation does  not show pathology that would require ongoing emergent intervention or inpatient treatment. Pt is hemodynamically stable and mentating appropriately. Discussed findings and plan with patient/guardian, who agrees with care plan. All questions answered. Return precautions discussed and outpatient follow up given.       Wynetta Emeryicole Dreshawn Hendershott, PA-C 06/24/15 0109  Leta BaptistEmily Roe Nguyen, MD 06/26/15 [phone]

## 2015-06-25 ENCOUNTER — Encounter (HOSPITAL_COMMUNITY): Payer: Self-pay

## 2015-06-25 ENCOUNTER — Emergency Department (HOSPITAL_COMMUNITY)
Admission: EM | Admit: 2015-06-25 | Discharge: 2015-06-26 | Disposition: A | Discharge status: Home / Self Care | Payer: Self-pay | Attending: Emergency Medicine | Admitting: Emergency Medicine

## 2015-06-25 DIAGNOSIS — X58XXXA Exposure to other specified factors, initial encounter: Secondary | ICD-10-CM | POA: Insufficient documentation

## 2015-06-25 DIAGNOSIS — F17200 Nicotine dependence, unspecified, uncomplicated: Secondary | ICD-10-CM | POA: Insufficient documentation

## 2015-06-25 DIAGNOSIS — S3093XD Unspecified superficial injury of penis, subsequent encounter: Secondary | ICD-10-CM | POA: Insufficient documentation

## 2015-06-25 DIAGNOSIS — S3994XD Unspecified injury of external genitals, subsequent encounter: Secondary | ICD-10-CM

## 2015-06-25 LAB — URINALYSIS, ROUTINE W REFLEX MICROSCOPIC
Bilirubin Urine: NEGATIVE
GLUCOSE, UA: NEGATIVE mg/dL
Hgb urine dipstick: NEGATIVE
KETONES UR: NEGATIVE mg/dL
LEUKOCYTES UA: NEGATIVE
NITRITE: NEGATIVE
Protein, ur: NEGATIVE mg/dL
Specific Gravity, Urine: 1.013 (ref 1.005–1.030)
pH: 6.5 (ref 5.0–8.0)

## 2015-06-25 LAB — URINE CULTURE

## 2015-06-25 MED ORDER — AZITHROMYCIN 250 MG PO TABS
1000.0000 mg | ORAL_TABLET | Freq: Once | ORAL | Status: AC
  Administered 2015-06-25: 1000 mg via ORAL
  Filled 2015-06-25: qty 4

## 2015-06-25 MED ORDER — CEFTRIAXONE SODIUM 250 MG IJ SOLR
250.0000 mg | Freq: Once | INTRAMUSCULAR | Status: AC
  Administered 2015-06-25: 250 mg via INTRAMUSCULAR
  Filled 2015-06-25: qty 250

## 2015-06-25 MED ORDER — IBUPROFEN 800 MG PO TABS
800.0000 mg | ORAL_TABLET | Freq: Once | ORAL | Status: AC
  Administered 2015-06-25: 800 mg via ORAL
  Filled 2015-06-25: qty 1

## 2015-06-25 MED ORDER — LIDOCAINE HCL (PF) 1 % IJ SOLN
INTRAMUSCULAR | Status: AC
  Administered 2015-06-25: 5 mL
  Filled 2015-06-25: qty 5
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
## 2015-06-25 NOTE — ED Notes (Signed)
PA at bedside.

## 2015-06-25 NOTE — ED Notes (Signed)
Pt was here on the 27th for same and is not any better. Was dx with dysuria but still having painful urination. Also states it hurts to touch it. States the last time he had intercourse with his fiancee was five days ago before the pain started. Had unprotected sex but they are monogomous.

## 2015-06-26 LAB — RPR: RPR Ser Ql: NONREACTIVE

## 2015-06-26 LAB — HIV ANTIBODY (ROUTINE TESTING W REFLEX): HIV SCREEN 4TH GENERATION: NONREACTIVE

## 2015-06-26 MED ORDER — NAPROXEN 500 MG PO TABS: 500.0000 mg | ORAL_TABLET | Freq: Two times a day (BID) | ORAL | Status: DC | PRN

## 2015-06-26 NOTE — Discharge Instructions (Signed)
You have injured your penis, likely a muscle strain.  Please take pain medication as needed.  Avoid sexual activity until your symptom resolved.  Follow up with urologist for further care.    Muscle Strain A muscle strain is an injury that occurs when a muscle is stretched beyond its normal length. Usually a small number of muscle fibers are torn when this happens. Muscle strain is rated in degrees. First-degree strains have the least amount of muscle fiber tearing and pain. Second-degree and third-degree strains have increasingly more tearing and pain.  Usually, recovery from muscle strain takes 1-2 weeks. Complete healing takes 5-6 weeks.  CAUSES  Muscle strain happens when a sudden, violent force placed on a muscle stretches it too far. This may occur with lifting, sports, or a fall.  RISK FACTORS Muscle strain is especially common in athletes.  SIGNS AND SYMPTOMS At the site of the muscle strain, there may be:  Pain.  Bruising.  Swelling.  Difficulty using the muscle due to pain or lack of normal function. DIAGNOSIS  Your health care provider will perform a physical exam and ask about your medical history. TREATMENT  Often, the best treatment for a muscle strain is resting, icing, and applying cold compresses to the injured area.  HOME CARE INSTRUCTIONS   Use the PRICE method of treatment to promote muscle healing during the first 2-3 days after your injury. The PRICE method involves:  Protecting the muscle from being injured again.  Restricting your activity and resting the injured body part.  Icing your injury. To do this, put ice in a plastic bag. Place a towel between your skin and the bag. Then, apply the ice and leave it on from 15-20 minutes each hour. After the third day, switch to moist heat packs.  Apply compression to the injured area with a splint or elastic bandage. Be careful not to wrap it too tightly. This may interfere with blood circulation or increase  swelling.  Elevate the injured body part above the level of your heart as often as you can.  Only take over-the-counter or prescription medicines for pain, discomfort, or fever as directed by your health care provider.  Warming up prior to exercise helps to prevent future muscle strains. SEEK MEDICAL CARE IF:   You have increasing pain or swelling in the injured area.  You have numbness, tingling, or a significant loss of strength in the injured area. MAKE SURE YOU:   Understand these instructions.  Will watch your condition.  Will get help right away if you are not doing well or get worse.   This information is not intended to replace advice given to you by your health care provider. Make sure you discuss any questions you have with your health care provider.   Document Released: 06/13/2005 Document Revised: 04/03/2013 Document Reviewed: 01/10/2013 Elsevier Interactive Patient Education [employer]2016 [employer]

## 2015-06-26 NOTE — ED Notes (Signed)
Patient verbalized understanding of discharge instructions and denies any further needs or questions at this time. VS stable. Patient ambulatory with steady gait.  

## 2015-06-27 LAB — GC/CHLAMYDIA PROBE AMP (~~LOC~~) NOT AT ARMC
Chlamydia: NEGATIVE
Neisseria Gonorrhea: NEGATIVE

## 2015-06-29 ENCOUNTER — Encounter (HOSPITAL_COMMUNITY): Payer: Self-pay | Admitting: *Deleted

## 2015-06-29 ENCOUNTER — Emergency Department (HOSPITAL_COMMUNITY)
Admission: EM | Admit: 2015-06-29 | Discharge: 2015-06-29 | Disposition: A | Discharge status: Home / Self Care | Payer: Self-pay | Attending: Emergency Medicine | Admitting: Emergency Medicine

## 2015-06-29 DIAGNOSIS — F17200 Nicotine dependence, unspecified, uncomplicated: Secondary | ICD-10-CM | POA: Insufficient documentation

## 2015-06-29 DIAGNOSIS — H109 Unspecified conjunctivitis: Secondary | ICD-10-CM | POA: Insufficient documentation

## 2015-06-29 MED ORDER — POLYMYXIN B-TRIMETHOPRIM 10000-0.1 UNIT/ML-% OP SOLN: 1.0000 [drp] | OPHTHALMIC | Status: DC

## 2015-06-29 MED ORDER — TETRACAINE HCL 0.5 % OP SOLN
1.0000 [drp] | Freq: Once | OPHTHALMIC | Status: AC
  Administered 2015-06-29: 1 [drp] via OPHTHALMIC
  Filled 2015-06-29: qty 2

## 2015-06-29 MED ORDER — FLUORESCEIN SODIUM 1 MG OP STRP
1.0000 | ORAL_STRIP | Freq: Once | OPHTHALMIC | Status: AC
  Administered 2015-06-29: 1 via OPHTHALMIC
  Filled 2015-06-29: qty 1

## 2015-06-29 NOTE — ED Notes (Signed)
Pt reports infection in both eyes

## 2015-06-29 NOTE — ED Notes (Signed)
Declined W/C at D/C and was escorted to lobby by RN. 

## 2015-06-29 NOTE — Discharge Instructions (Signed)
1. Medications: polytrim eye drops, usual home medications 2. Treatment: rest, drink plenty of fluids 3. Follow Up: please followup with your primary doctor for discussion of your diagnoses and further evaluation after today's visit and with ophthalmology for persistent symptoms; if you do not have a primary care doctor use the resource guide provided to find one; please return to the ER for eye pain, vision change, new or worsening symptoms   Bacterial Conjunctivitis Bacterial conjunctivitis, commonly called pink eye, is an inflammation of the clear membrane that covers the Luhn part of the eye (conjunctiva). The inflammation can also happen on the underside of the eyelids. The blood vessels in the conjunctiva become inflamed, causing the eye to become red or pink. Bacterial conjunctivitis may spread easily from one eye to another and from person to person (contagious).  CAUSES  Bacterial conjunctivitis is caused by bacteria. The bacteria may come from your own skin, your upper respiratory tract, or from someone else with bacterial conjunctivitis. SYMPTOMS  The normally Trotti color of the eye or the underside of the eyelid is usually pink or red. The pink eye is usually associated with irritation, tearing, and some sensitivity to light. Bacterial conjunctivitis is often associated with a thick, yellowish discharge from the eye. The discharge may turn into a crust on the eyelids overnight, which causes your eyelids to stick together. If a discharge is present, there may also be some blurred vision in the affected eye. DIAGNOSIS  Bacterial conjunctivitis is diagnosed by your caregiver through an eye exam and the symptoms that you report. Your caregiver looks for changes in the surface tissues of your eyes, which may point to the specific type of conjunctivitis. A sample of any discharge may be collected on a cotton-tip swab if you have a severe case of conjunctivitis, if your cornea is affected, or if  you keep getting repeat infections that do not respond to treatment. The sample will be sent to a lab to see if the inflammation is caused by a bacterial infection and to see if the infection will respond to antibiotic medicines. TREATMENT   Bacterial conjunctivitis is treated with antibiotics. Antibiotic eyedrops are most often used. However, antibiotic ointments are also available. Antibiotics pills are sometimes used. Artificial tears or eye washes may ease discomfort. HOME CARE INSTRUCTIONS   To ease discomfort, apply a cool, clean washcloth to your eye for 10-20 minutes, 3-4 times a day.  Gently wipe away any drainage from your eye with a warm, wet washcloth or a cotton ball.  Wash your hands often with soap and water. Use paper towels to dry your hands.  Do not share towels or washcloths. This may spread the infection.  Change or wash your pillowcase every day.  You should not use eye makeup until the infection is gone.  Do not operate machinery or drive if your vision is blurred.  Stop using contact lenses. Ask your caregiver how to sterilize or replace your contacts before using them again. This depends on the type of contact lenses that you use.  When applying medicine to the infected eye, do not touch the edge of your eyelid with the eyedrop bottle or ointment tube. SEEK IMMEDIATE MEDICAL CARE IF:   Your infection has not improved within 3 days after beginning treatment.  You had yellow discharge from your eye and it returns.  You have increased eye pain.  Your eye redness is spreading.  Your vision becomes blurred.  You have a fever or persistent  symptoms for more than 2-3 days.  You have a fever and your symptoms suddenly get worse.  You have facial pain, redness, or swelling. MAKE SURE YOU:   Understand these instructions.  Will watch your condition.  Will get help right away if you are not doing well or get worse.   This information is not intended to  replace advice given to you by your health care provider. Make sure you discuss any questions you have with your health care provider.   Document Released: 06/13/2005 Document Revised: 07/04/2014 Document Reviewed: 11/14/2011 Elsevier Interactive Patient Education 2016 [employer].   Emergency Department Resource Guide 1) Find a Doctor and Pay Out of Pocket Although you won't have to find out who is covered by your insurance plan, it is a good idea to ask around and get recommendations. You will then need to call the office and see if the doctor you have chosen will accept you as a new patient and what types of options they offer for patients who are self-pay. Some doctors offer discounts or will set up payment plans for their patients who do not have insurance, but you will need to ask so you aren't surprised when you get to your appointment.  2) Contact Your Local Health Department Not all health departments have doctors that can see patients for sick visits, but many do, so it is worth a call to see if yours does. If you don't know where your local health department is, you can check in your phone book. The CDC also has a tool to help you locate your state's health department, and many state websites also have listings of all of their local health departments.  3) Find a Walk-in Clinic If your illness is not likely to be very severe or complicated, you may want to try a walk in clinic. These are popping up all over the country in pharmacies, drugstores, and shopping centers. They're usually staffed by nurse practitioners or physician assistants that have been trained to treat common illnesses and complaints. They're usually fairly quick and inexpensive. However, if you have serious medical issues or chronic medical problems, these are probably not your best option.  No Primary Care Doctor: - Call Health Connect at  [phone] - they can help you locate a primary care doctor that  accepts your  insurance, provides certain services, etc. - Physician Referral Service- [phone]  Chronic Pain Problems: Organization         Address  Phone   Notes  [facility]  [phone] Patients need to be referred by their primary care doctor.   Medication Assistance: Organization         Address  Phone   Notes  [facility] Medication [facility] [address] [place] [place], [place] [address] [phone] --Must be a resident of [facility] -- Must have NO insurance coverage whatsoever (no Medicaid/ Medicare, etc.) -- The pt. MUST have a primary care doctor that directs their care regularly and follows them in the community   [facility]ist  [phone]   [employer]  [phone]    Agencies that provide inexpensive medical care: Organization         Address  Phone   Notes  [facility]  [phone]   [facility]    [phone]   [facility] [address] [place], [place] [address] [phone]   [facility] [address] [place] [phone]   Planned Parenthood    [phone]   [facility]    [phone]   Community Health and [facility]  [address], [place] Phone:  [phone], Fax:  [phone] Hours of Operation:  9 am - 6 pm, M-F.  Also accepts Medicaid/Medicare and self-pay.  [facility]  [address], [place] Phone: [phone], Fax: [phone]. Hours of Operation:  8:30 am - 5:30 pm, M-F.  Also accepts Medicaid and self-pay.  [facility] [place] [address] [place] Phone: [phone]   [facility] [address] Natasha Bence [place], [place] [phone], Ext. [phone] Mondays & Thursdays: 7-9 AM.  First 15 patients are seen on a first come, first serve basis.    Medicaid-accepting [facility] Providers:  Organization          Address  Phone   Notes  [facility] [address], [place] [phone] Also accepts self-pay patients.  [facility] [address] [place] [address] Laurell Josephs [place], [place]  [phone]   [facility] [address], [place] [phone]   [facility] [address], [place] [phone]   Renaye Rakers [address], [place]   [phone] Only accepts [place] Access [place] patients after they have their name applied to their card.   Self-Pay (no insurance) in [facility]:  Organization         Address  Phone   Notes  Sickle Cell Patients, [facility] [address] [place], [place] [phone]   [facility] [address] [place], [place] [phone]   [facility]  [address], [place] [phone]   [facility]/Dr. Osei-Bonsu  [address], [place] or [address], [place] [phone] Phone number for both [place] and [place] locations is the same.  [facility] and [facility] [address], [place] [phone]   [facility] [address], [place] or [address] [phone]   [facility] [address], [place] [phone], phone; [phone], fax Sees patients 1st and 3rd Saturday of every month.  Must not qualify for public or private insurance (i.e. Medicaid, Medicare, [place] Health Choice, Veterans' Benefits)  Household income should be no more than 200% of the poverty level The clinic cannot treat you if you are pregnant or think you are pregnant  Sexually transmitted diseases are not treated at the clinic.    Dental Care: Organization         Address  Phone  Notes  [facility] Department of [facility] [address] [place],  [place] [phone] Accepts children up to age 27 who are enrolled in [place] or [place] Health Choice; pregnant women with a Medicaid card; and children who have applied for Medicaid or [place] Health Choice, but were declined, whose parents can pay a reduced fee at time of service.  [facility] Department of [facility]  [address], [place] [phone] Accepts children up to age 36 who are enrolled in [place] or [place] Health Choice; pregnant women with a Medicaid card; and children who have  applied for Medicaid or [place] Health Choice, but were declined, whose parents can pay a reduced fee at time of service.  [facility]  [address], [place] [phone] Patients are seen by appointment only. Walk-ins are not accepted. [facility] will see patients 28 years of age and older. Monday - Tuesday (8am-5pm) Most Wednesdays (8:30-5pm) $30 per visit, cash only  [facility]  [address], [facility] [phone] Patients are seen by appointment only. Walk-ins are not accepted. [facility] will see patients 10218 years of age and older. One Wednesday Evening (Monthly: Volunteer Based).  $30 per visit, cash only  [employer] [facility] [facility]  [phone] for adults; Children under age 534, call Graduate Pediatric Dentistry at [phone]. Children aged 754-14, please call [phone] to request a pediatric application.  Dental services are provided in all areas of dental care including fillings, crowns and bridges, complete and partial dentures, implants, gum treatment, root canals, and extractions. Preventive care is also provided. Treatment is provided to both adults and children. Patients are selected via a lottery and there is often a waiting list.   [facility] [address], [place]  [phone] [email or web address]   [facility] [address], [place], [place]  [phone], Ext. [phone] Second and Fourth Thursday of each month, opens at 6:30 AM; Clinic ends at 9 AM.  Patients are seen on a first-come first-served basis, and a limited number are seen during each clinic.   [facility]  [address], [place], [place] [phone]   Eligibility Requirements You must have lived in [place], [place], or [place] counties for at least the last three months.   You cannot be eligible for state or federal sponsored National Cityhealthcare insurance, including CIGNAVeterans Administration, [place]icaid, or [employer]icare.   You generally cannot be eligible for healthcare insurance through your employer.    How to apply: Eligibility screenings are held every Tuesday and Wednesday afternoon from 1:00 pm until 4:00 pm. You do not need an appointment for the interview!  [facility] [address], [place], [place] [phone]   [facility]  [phone]   [facility]  [phone]   [facility]  [phone]    Behavioral Health Resources in the Community: Intensive Outpatient Programs Organization         Address  Phone  Notes  [facility] [address], [place], [place] [phone]   [facility] [address], [place], [place] [phone]   [facility] [address], [place], [place]  [phone]   [facility] [address],  [place], [place] [phone] or [phone]   Substance Abuse Resources Organization         Address  Phone  Notes  [facility]  [phone]   [facility]  [phone]   [facility]  [phone]   [facility]  [phone]   Residential & Outpatient Substance Abuse Program  [phone]   Psychological Services Organization         Address  Phone  Notes  [facility]  [phone]- [phone]   [facility]  [phone]- [phone]    [facility] [address], [place] [phone] or [phone]    Mobile Crisis Teams Organization  Address  Phone  Notes  [facility], Mobile Crisis Care Unit  [phone]   [facility]  [address] [place], [place]   [facility] [address], [place] [phone]    Self-Help/Support Groups Organization         Address  Phone             Notes  [place] of [place] - variety of support groups  [place] Call for more information  Narcotics Anonymous (NA), [facility] [address], [employer]   2 meetings at this location   Special educational needs teacher         Address  Phone  Notes  [facility] Residential [facility] [place],    [place]  [phone]   [facility]  [address] [place] [id number], [place], [place]   [place], [place] [phone] Admissions: 8am-3pm M-F  Incentives Substance [place] [address]    [place], [place] [phone]   [facility] [address] [place] [address] [place], [place], [place]   [facility] [address]  [place], [place]   [facility] - Intensive Outpatient [address], [place], [place]   [facility] ([place] [place].,  [place], [place] [phone] or [phone]   [facility] ([facility]) [address], [place], [place] Accepts Medicaid  [facility] [address],  [place] [phone] Substance Abuse/Addiction Treatment   [facility] Organization         Address  Phone  Notes  [facility]  [phone]   Domenic Schwab, PhD [address] Arlis Porta [place], [place]   [phone] or [phone]   [place], [place] [phone]   [facility] [address], [place], [place] [phone] Insurance/Medicaid/sponsorship through [facility] and Families [address], [address] [place]                                    [place], [place] [phone] [place] [address], [place] [phone]    Dr. Adele Schilder  [phone]   [facility] Dept. 1) [address], [place] 2) [place] 3)  [place] [address], [place] [phone]  [phone]   [place] [phone] or [phone] (After Hours)

## 2015-06-29 NOTE — ED Provider Notes (Signed)
CSN: [id number]     Arrival date & time 06/29/15  1155 History  By signing my name below, I, Essence Howell, attest that this documentation has been prepared under the direction and in the presence of Glean Hess, PA-C Electronically Signed: Charline Bills, ED Scribe 06/29/2015 at 1:30 PM.    No chief complaint on file.   The history is provided by the patient. No language interpreter was used.    HPI Comments: Lexie Morini is a 30 y.o. male who presents to the Emergency Department complaining of gradually worsening bilateral eye redness onset 2 days ago. Pt reports associated eye itching and tearing over the past 2 days. He denies exacerbating factors. No treatments tried PTA. He denies eye pain, eye discharge, photophobia, visual disturbances, fever, chills, nasal congestion, sore throat, cough. No sick contacts with similar symptoms.    No past medical history on file. No past surgical history on file. No family history on file. Social History  Substance Use Topics  . Smoking status: Current Every Day Smoker  . Smokeless tobacco: Not on file  . Alcohol Use: No    Review of Systems  Constitutional: Negative for fever and chills.  HENT: Negative for rhinorrhea and sore throat.   Eyes: Positive for redness and itching. Negative for photophobia, pain, discharge and visual disturbance.    Allergies  Review of patient's allergies indicates no known allergies.  Home Medications   Prior to Admission medications   Medication Sig Start Date End Date Taking? Authorizing Provider  naproxen (NAPROSYN) 500 MG tablet Take 1 tablet (500 mg total) by mouth 2 (two) times daily as needed for moderate pain. 06/26/15   Fayrene Helper, PA-C  trimethoprim-polymyxin b (POLYTRIM) ophthalmic solution Place 1 drop into both eyes every 4 (four) hours. Place 1 drop into both eyes every 4 hours for 7 days. 06/29/15   Mady Gemma, PA-C    BP 127/64 mmHg  Pulse 56  Temp(Src) 98.1 F (36.7 C)  (Oral)  Resp 17  Ht 5\' 7"  (1.702 m)  Wt 195 lb (88.451 kg)  BMI 30.53 kg/m2  SpO2 99% Physical Exam  Constitutional: He is oriented to person, place, and time. He appears well-developed and well-nourished. No distress.  HENT:  Head: Normocephalic and atraumatic.  Right Ear: External ear normal.  Left Ear: External ear normal.  Nose: Nose normal.  Mouth/Throat: Oropharynx is clear and moist. No oropharyngeal exudate.  Eyes: EOM and lids are normal. Pupils are equal, round, and reactive to light. Right eye exhibits no discharge and no exudate. Left eye exhibits no discharge and no exudate. Right conjunctiva is injected. Right conjunctiva has no hemorrhage. Left conjunctiva is injected. Left conjunctiva has no hemorrhage. No scleral icterus.  Slit lamp exam:      The right eye shows no corneal abrasion, no corneal flare, no corneal ulcer, no foreign body, no hyphema and no fluorescein uptake.       The left eye shows no corneal abrasion, no corneal flare, no corneal ulcer, no foreign body, no hyphema and no fluorescein uptake.  Neck: Normal range of motion. Neck supple.  Cardiovascular: Normal rate and regular rhythm.   Pulmonary/Chest: Effort normal and breath sounds normal. No respiratory distress.  Musculoskeletal: Normal range of motion. He exhibits no edema or tenderness.  Neurological: He is alert and oriented to person, place, and time.  Skin: Skin is warm and dry. He is not diaphoretic.  Psychiatric: He has a normal mood and affect. His behavior is  normal.  Nursing note and vitals reviewed.  ED Course  Procedures (including critical care time)  DIAGNOSTIC STUDIES: Oxygen Saturation is 99% on RA, normal by my interpretation.    COORDINATION OF CARE: 1:03 PM-Discussed treatment plan which includes eye exam and Polytrim ophthalmic solution with pt at bedside and pt agreed to plan.   Labs Review Labs Reviewed - No data to display  Imaging Review No results found.    EKG  Interpretation None      MDM   Final diagnoses:  Bilateral conjunctivitis    30 year old male presents with bilateral eye redness, irritation, and tearing, which he states started 2 days ago. Denies sick contact. Patient does not wear contact lenses. Denies fever, chills, nasal congestion, sore throat, cough, vision changes, significant pain, photophobia, discharge. Patient is afebrile. Vital signs stable. Conjunctival injection present bilaterally. EOMs intact. Pupils equally round and reactive to light bilaterally. No increased fluorescein uptake. No evidence of HSV, VZV, orbital cellulitis, hyphema, corneal ulcer, corneal abrasion, or trauma. Exam consistent with conjunctivitis. Will treat with polytrim eye drops. Advised patient to follow-up with ophthalmology for persistent symptoms. Return precautions discussed. Patient verbalizes his understanding and is in agreement with plan.  BP 127/64 mmHg  Pulse 56  Temp(Src) 98.1 F (36.7 C) (Oral)  Resp 17  Ht 5\' 7"  (1.702 m)  Wt 88.451 kg  BMI 30.53 kg/m2  SpO2 99%  I personally performed the services described in this documentation, which was scribed in my presence. The recorded information has been reviewed and is accurate.    Mady Gemmalizabeth C [place], PA-C 06/29/15 1428  Pricilla LovelessScott Goldston, MD 06/30/15 [phone]

## 2016-09-02 ENCOUNTER — Emergency Department (HOSPITAL_COMMUNITY): Payer: BLUE CROSS/BLUE SHIELD

## 2016-09-02 ENCOUNTER — Emergency Department (HOSPITAL_COMMUNITY)
Admission: EM | Admit: 2016-09-02 | Discharge: 2016-09-02 | Disposition: A | Discharge status: Home / Self Care | Payer: BLUE CROSS/BLUE SHIELD | Attending: Emergency Medicine | Admitting: Emergency Medicine

## 2016-09-02 ENCOUNTER — Encounter (HOSPITAL_COMMUNITY): Payer: Self-pay | Admitting: Emergency Medicine

## 2016-09-02 DIAGNOSIS — F17200 Nicotine dependence, unspecified, uncomplicated: Secondary | ICD-10-CM | POA: Insufficient documentation

## 2016-09-02 DIAGNOSIS — M436 Torticollis: Secondary | ICD-10-CM | POA: Diagnosis not present

## 2016-09-02 DIAGNOSIS — M542 Cervicalgia: Secondary | ICD-10-CM | POA: Diagnosis present

## 2016-09-02 DIAGNOSIS — Z79899 Other long term (current) drug therapy: Secondary | ICD-10-CM | POA: Diagnosis not present

## 2016-09-02 MED ORDER — IBUPROFEN 600 MG PO TABS: 600.0000 mg | ORAL_TABLET | Freq: Four times a day (QID) | ORAL | 0 refills | Status: DC | PRN

## 2016-09-02 MED ORDER — METHOCARBAMOL 500 MG PO TABS: 1000.0000 mg | ORAL_TABLET | Freq: Three times a day (TID) | ORAL | 0 refills | Status: DC | PRN

## 2016-09-02 NOTE — Discharge Instructions (Signed)
In addition to the medications, apply a heating pad to your neck and shoulder area for 20 minutes followed by stretching exercises.

## 2016-09-02 NOTE — ED Triage Notes (Signed)
Patient is complaining of neck pain x 2 days. Denies nausea, vomiting, diarrhea, fever.

## 2016-09-02 NOTE — ED Provider Notes (Signed)
[facility]-EMERGENCY DEPT Provider Note   CSN: [id number] Arrival date & time: 09/02/16  1236   By signing my name below, I, Teofilo PodMatthew P. Jamison, attest that this documentation has been prepared under the direction and in the presence of Burgess AmorJulie Claude Waldman, PA-C. Electronically Signed: Teofilo PodMatthew P. Jamison, ED Scribe. 09/02/2016. 1:56 PM.   History   Chief Complaint Chief Complaint  Patient presents with  . Neck Pain   The history is provided by the patient. No language interpreter was used.   HPI Comments:  Douglas Ali is a 31 y.o. male who presents to the Emergency Department complaining of constant, worsening neck pain x 2 days. Pt describes the pain as "sharp." He states that the pain is made worse with leaning back. Pt denies any previous similar pain, and denies injury/trauma, stating he woke with pain and stiffness in the neck.  He denies fevers, chills, weakess or  Numbness in his arms or legs. He states that he is otherwise healthy. Pt has applied heat and took tylenol last night with mild relief. Pt denies other associated symptoms.   History reviewed. No pertinent past medical history.  There are no active problems to display for this patient.   History reviewed. No pertinent surgical history.     Home Medications    Prior to Admission medications   Medication Sig Start Date End Date Taking? Authorizing Provider  ibuprofen (ADVIL,MOTRIN) 600 MG tablet Take 1 tablet (600 mg total) by mouth every 6 (six) hours as needed. 09/02/16   Burgess AmorJulie Zonnie Landen, PA-C  methocarbamol (ROBAXIN) 500 MG tablet Take 2 tablets (1,000 mg total) by mouth every 8 (eight) hours as needed for muscle spasms. 09/02/16   Burgess AmorJulie Dashiel Bergquist, PA-C  naproxen (NAPROSYN) 500 MG tablet Take 1 tablet (500 mg total) by mouth 2 (two) times daily as needed for moderate pain. 06/26/15   Fayrene HelperBowie Tran, PA-C  trimethoprim-polymyxin b (POLYTRIM) ophthalmic solution Place 1 drop into both eyes every 4 (four) hours. Place 1 drop into both eyes every  4 hours for 7 days. 06/29/15   Mady GemmaElizabeth C Westfall, PA-C    Family History No family history on file.  Social History Social History  Substance Use Topics  . Smoking status: Current Every Day Smoker  . Smokeless tobacco: Never Used  . Alcohol use No     Allergies   Patient has no known allergies.   Review of Systems Review of Systems  Constitutional: Negative for fever.  HENT: Negative.   Musculoskeletal: Positive for neck pain.  Neurological: Negative for weakness, numbness and headaches.     Physical Exam Updated Vital Signs BP 127/76 (BP Location: Right Arm)   Pulse 88   Temp 98.3 F (36.8 C) (Oral)   Resp 14   Ht 5\' 7"  (1.702 m)   Wt 88.5 kg   SpO2 100%   BMI 30.54 kg/m   Physical Exam  Constitutional: He appears well-developed and well-nourished. No distress.  HENT:  Head: Normocephalic and atraumatic.  Eyes: Conjunctivae are normal.  Cardiovascular: Normal rate.   Pulmonary/Chest: Effort normal.  Abdominal: He exhibits no distension.  Musculoskeletal:  Left paracervical tenderness with trapezius muscle spasm. Equal grip strength. Sensation intact in arms. 1+ bilateral bicep DTRs. Reduced ROM with leftward head rotation.   Neurological: He is alert.  Skin: Skin is warm and dry.  Psychiatric: He has a normal mood and affect.  Nursing note and vitals reviewed.    ED Treatments / Results  DIAGNOSTIC STUDIES:  Oxygen Saturation is  100% on RA, normal by my interpretation.    COORDINATION OF CARE:  1:53 PM Will order xray of neck. Discussed treatment plan with pt at bedside and pt agreed to plan.   Labs (all labs ordered are listed, but only abnormal results are displayed) Labs Reviewed - No data to display  EKG  EKG Interpretation None       Radiology Dg Cervical Spine Complete  Result Date: 09/02/2016 CLINICAL DATA:  Neck spine stiffness, no injury. EXAM: CERVICAL SPINE - COMPLETE 4+ VIEW COMPARISON:  None. FINDINGS: Cervical vertebral  bodies and posterior elements appear intact and aligned to the inferior endplate of C6, the most caudal well visualized level. Straightened cervical lordosis. Intervertebral disc heights preserved. No neural foraminal narrowing. No destructive bony lesions. Lateral masses in alignment. Mild prevertebral soft tissue swelling without subcutaneous gas or radiopaque foreign bodies. IMPRESSION: Mild prevertebral soft tissue swelling may be infectious or inflammatory. No acute osseous process. Electronically Signed   By: Awilda Metro M.D.   On: 09/02/2016 14:55    Procedures Procedures (including critical care time)  Medications Ordered in ED Medications - No data to display   Initial Impression / Assessment and Plan / ED Course  I have reviewed the triage vital signs and the nursing notes.  Pertinent labs & imaging results that were available during my care of the patient were reviewed by me and considered in my medical decision making (see chart for details).     Pt with exam and h/o c/w torticollis, left trapezius muscle spasm.  Imaging reviewed, he has no sore throat, cough, sob, dyphonia, and no fevers.  Advised heat tx followed by ROM exercises which were demonstrated.  Robaxin, ibuprofen, prn f/u for persistent sx, referral given.  Final Clinical Impressions(s) / ED Diagnoses   Final diagnoses:  Torticollis, acute    New Prescriptions Discharge Medication List as of 09/02/2016  3:50 PM    START taking these medications   Details  ibuprofen (ADVIL,MOTRIN) 600 MG tablet Take 1 tablet (600 mg total) by mouth every 6 (six) hours as needed., Starting Fri 09/02/2016, Print    methocarbamol (ROBAXIN) 500 MG tablet Take 2 tablets (1,000 mg total) by mouth every 8 (eight) hours as needed for muscle spasms., Starting Fri 09/02/2016, Print      I personally performed the services described in this documentation, which was scribed in my presence. The recorded information has been reviewed and  is accurate.     Burgess Amor, PA-C 09/03/16 2112    Nira Conn, MD 09/06/16 [phone]

## 2017-02-02 ENCOUNTER — Encounter (HOSPITAL_COMMUNITY): Payer: Self-pay | Admitting: Emergency Medicine

## 2017-02-02 ENCOUNTER — Emergency Department (HOSPITAL_COMMUNITY)
Admission: EM | Admit: 2017-02-02 | Discharge: 2017-02-02 | Disposition: A | Discharge status: Home / Self Care | Payer: BLUE CROSS/BLUE SHIELD | Attending: Emergency Medicine | Admitting: Emergency Medicine

## 2017-02-02 DIAGNOSIS — F17200 Nicotine dependence, unspecified, uncomplicated: Secondary | ICD-10-CM | POA: Insufficient documentation

## 2017-02-02 DIAGNOSIS — H10023 Other mucopurulent conjunctivitis, bilateral: Secondary | ICD-10-CM | POA: Diagnosis not present

## 2017-02-02 DIAGNOSIS — H109 Unspecified conjunctivitis: Secondary | ICD-10-CM

## 2017-02-02 DIAGNOSIS — H578 Other specified disorders of eye and adnexa: Secondary | ICD-10-CM | POA: Diagnosis present

## 2017-02-02 MED ORDER — POLYMYXIN B-TRIMETHOPRIM 10000-0.1 UNIT/ML-% OP SOLN: 1.0000 [drp] | OPHTHALMIC | 0 refills | Status: DC

## 2017-02-02 NOTE — Discharge Instructions (Signed)
It was my pleasure taking care of you today!   Use antibiotic drops as directed.   Pink eye is very contagious and spreads by direct contact. Try to avoid rubbing eyes and wash hands often.  You may be given antibiotic eyedrops as part of your treatment. Before using your eye medicine, remove all drainage from the eye by washing gently with warm water and cotton balls. Continue to use the medication until you have awakened 2 mornings in a row without discharge from the eye. Do not rub your eye. This increases the irritation and helps spread infection. Use separate towels from other household members. Wash your hands with soap and water before and after touching your eyes. Use cold compresses to reduce pain and sunglasses to relieve irritation from light. Do not wear contact lenses until the infection is gone.   SEEK MEDICAL CARE IF:  Your symptoms are not better after 3 days of treatment.  You have increased pain or trouble seeing.  The outer eyelids become very red or swollen.  You develop double vision or your vision becomes blurred or worsens in any way.  You have trouble moving your eyes.  You develop a severe headache, severe neck pain, or neck stiffness.  You develop repeated vomiting.  You have a fever or persistent symptoms for more than 72 hours.  You have a fever and your symptoms suddenly get worse. \

## 2017-02-02 NOTE — ED Triage Notes (Signed)
Pt from home with c/o redness and pain in bilateral eyes that began today. Pt's daughter recently was diagnosed with pink eye

## 2017-02-02 NOTE — ED Provider Notes (Signed)
[facility]-EMERGENCY DEPT Provider Note   CSN: [id number] Arrival date & time: 02/02/17  2021     History   Chief Complaint No chief complaint on file.   HPI Douglas Ali is a 31 y.o. male.  The history is provided by the patient and medical records. No language interpreter was used.   Douglas Ali is an otherwise healthy 31 y.o. male who presents to ER for bilateral eye redness that began this morning. Patient states that he awoke and both eyes were crusted over with thick yellow discharge. No visual changes. Daughter was diagnosed with pink eye last week and just finished eye drops. He did use leftover eye drops for one dose, but states there was hardly any medicine left. Denies getting anything in his eyes. No foreign body sensation. No cough, congestion, fever, chills or any other symptoms.   History reviewed. No pertinent past medical history.  There are no active problems to display for this patient.   History reviewed. No pertinent surgical history.     Home Medications    Prior to Admission medications   Medication Sig Start Date End Date Taking? Authorizing Provider  ibuprofen (ADVIL,MOTRIN) 600 MG tablet Take 1 tablet (600 mg total) by mouth every 6 (six) hours as needed. 09/02/16   Burgess AmorIdol, Julie, PA-C  methocarbamol (ROBAXIN) 500 MG tablet Take 2 tablets (1,000 mg total) by mouth every 8 (eight) hours as needed for muscle spasms. 09/02/16   Burgess AmorIdol, Julie, PA-C  naproxen (NAPROSYN) 500 MG tablet Take 1 tablet (500 mg total) by mouth 2 (two) times daily as needed for moderate pain. 06/26/15   Fayrene Helperran, Bowie, PA-C  trimethoprim-polymyxin b (POLYTRIM) ophthalmic solution Place 1 drop into both eyes every 4 (four) hours. 02/02/17   Ifeanyichukwu Wickham, Chase PicketJaime Pilcher, PA-C    Family History No family history on file.  Social History Social History  Substance Use Topics  . Smoking status: Current Every Day Smoker  . Smokeless tobacco: Never Used  . Alcohol use No     Allergies   Patient has  no known allergies.   Review of Systems Review of Systems  Constitutional: Negative for chills and fever.  HENT: Negative for congestion, ear pain and sore throat.   Eyes: Positive for discharge, redness and itching. Negative for photophobia, pain and visual disturbance.  Respiratory: Negative for cough.      Physical Exam Updated Vital Signs BP 131/82 (BP Location: Left Arm)   Pulse (!) 50   Temp 98.6 F (37 C) (Oral)   Resp 16   SpO2 100%   Physical Exam  Constitutional: He appears well-developed and well-nourished. No distress.  HENT:  Head: Normocephalic and atraumatic.  Eyes: Pupils are equal, round, and reactive to light. EOM are normal. Right eye exhibits discharge. Left eye exhibits discharge. Right conjunctiva is injected. Left conjunctiva is injected.  Neck: Neck supple.  Cardiovascular: Normal rate, regular rhythm and normal heart sounds.   No murmur heard. Pulmonary/Chest: Effort normal and breath sounds normal. No respiratory distress. He has no wheezes. He has no rales.  Musculoskeletal: Normal range of motion.  Neurological: He is alert.  Skin: Skin is warm and dry.  Nursing note and vitals reviewed.    ED Treatments / Results  Labs (all labs ordered are listed, but only abnormal results are displayed) Labs Reviewed - No data to display  EKG  EKG Interpretation None       Radiology No results found.  Procedures Procedures (including critical care time)  Medications  Ordered in ED Medications - No data to display   Initial Impression / Assessment and Plan / ED Course  I have reviewed the triage vital signs and the nursing notes.  Pertinent labs & imaging results that were available during my care of the patient were reviewed by me and considered in my medical decision making (see chart for details).    Douglas Ali is a 31 y.o. male who presents to ED for redness and discharge to bilateral eyes which began this morning. Daughter recently  completed treated for pink eye. Exam is consistent with conjunctivitis. Presentation not concerning for iritis or corneal abrasions. No consensual photophobia or signs of entrapment. Patient discharged home with ABX gtts.  Personal hygiene and frequent handwashing discussed. Patient advised to follow up with ophthalmologist if symptoms persist or worsen. Return precautions discussed. Patient verbalizes understanding and agreement with plan. All questions answered.   Final Clinical Impressions(s) / ED Diagnoses   Final diagnoses:  Bacterial conjunctivitis    New Prescriptions New Prescriptions   TRIMETHOPRIM-POLYMYXIN B (POLYTRIM) OPHTHALMIC SOLUTION    Place 1 drop into both eyes every 4 (four) hours.     Ott Zimmerle, Chase Picket, PA-C 02/02/17 2211    Nira Conn, MD 02/03/17 [phone]

## 2017-09-26 ENCOUNTER — Emergency Department (HOSPITAL_COMMUNITY)
Admission: EM | Admit: 2017-09-26 | Discharge: 2017-09-26 | Disposition: A | Discharge status: Home / Self Care | Payer: BLUE CROSS/BLUE SHIELD | Attending: Physician Assistant | Admitting: Physician Assistant

## 2017-09-26 ENCOUNTER — Encounter (HOSPITAL_COMMUNITY): Payer: Self-pay | Admitting: Obstetrics and Gynecology

## 2017-09-26 ENCOUNTER — Emergency Department (HOSPITAL_COMMUNITY): Payer: BLUE CROSS/BLUE SHIELD

## 2017-09-26 DIAGNOSIS — M79642 Pain in left hand: Secondary | ICD-10-CM

## 2017-09-26 DIAGNOSIS — Z87891 Personal history of nicotine dependence: Secondary | ICD-10-CM | POA: Insufficient documentation
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
## 2017-09-26 NOTE — Discharge Instructions (Addendum)
Your x-ray is reassuring.  No broken bones.  As we discussed please use information of the back of this packet to establish care with a primary doctor.  You blood pressure was elevated, please have this rechecked by primary doctor.  You can take 800 mg ibuprofen every 6 hours for pain.  Please ice the wrist and elevate.
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
## 2018-03-06 IMAGING — CR DG CERVICAL SPINE COMPLETE 4+V
7 series · 7 of 7 positions shown · non-contrast
Comparison: None.

CLINICAL DATA: Neck spine stiffness, no injury.

EXAM:
CERVICAL SPINE - COMPLETE 4+ VIEW

[w cervical spine lat]
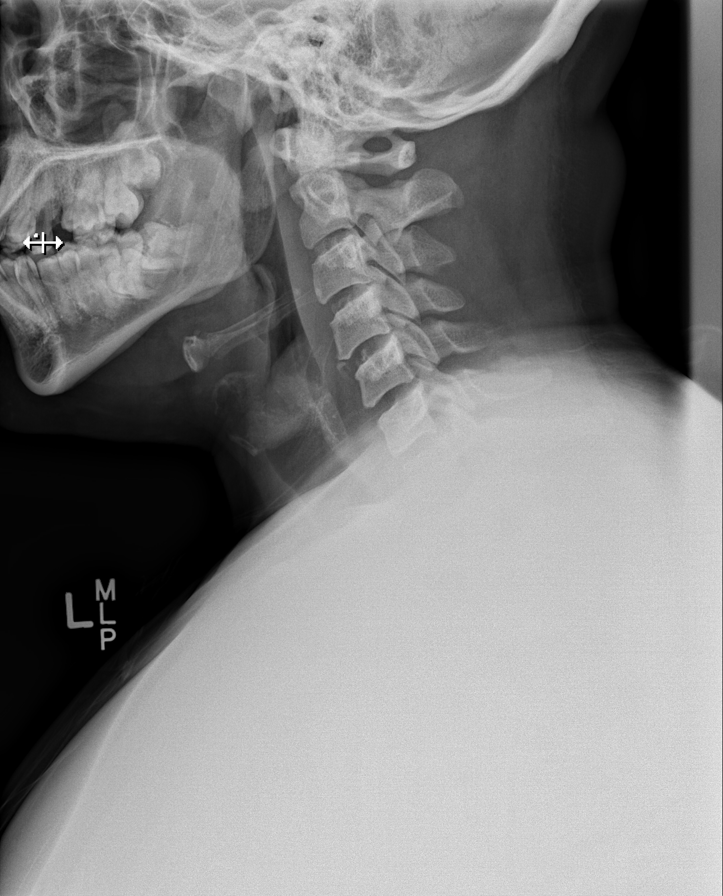

[w cervical spine ap_obl (1 of 2)]
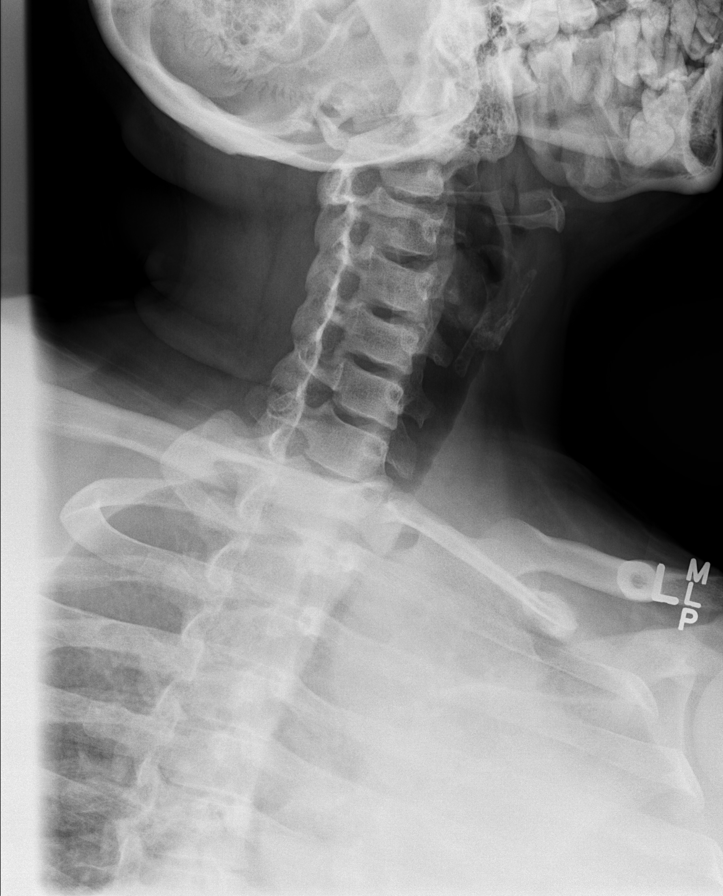

[w cervical spine ap_obl (2 of 2)]
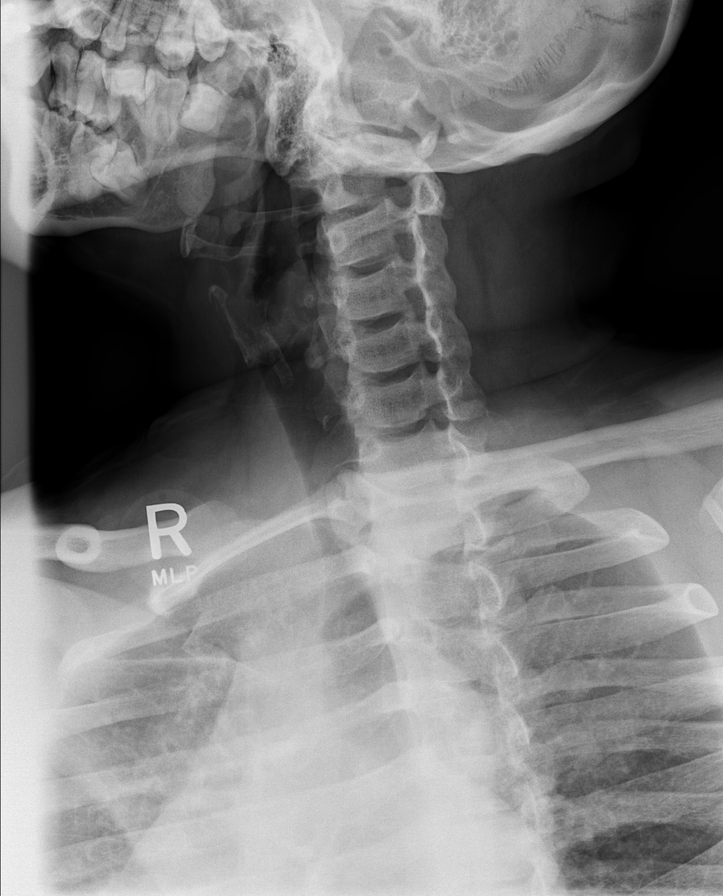

[w cervical spine ap]
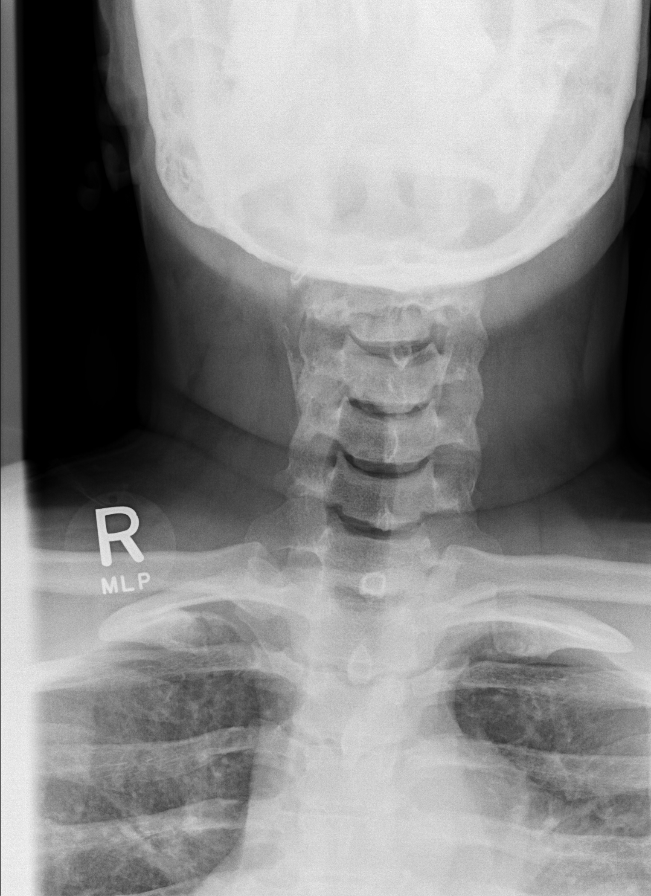

[w cervical spine odontoid]
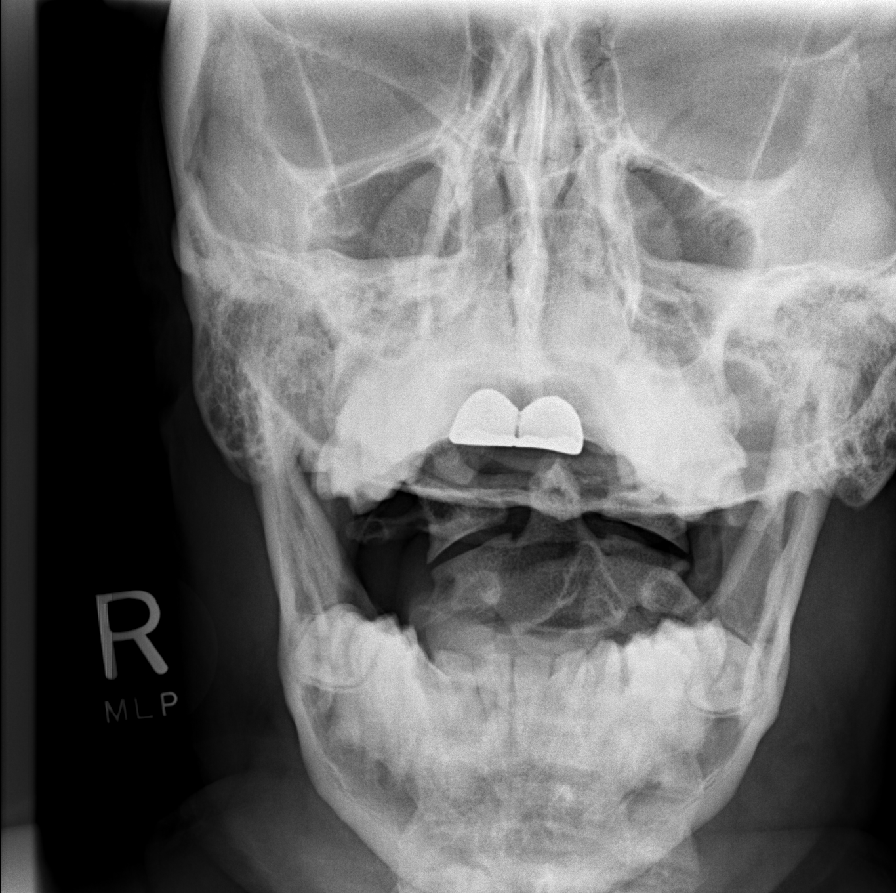

[w cervical swimmers (1 of 2)]
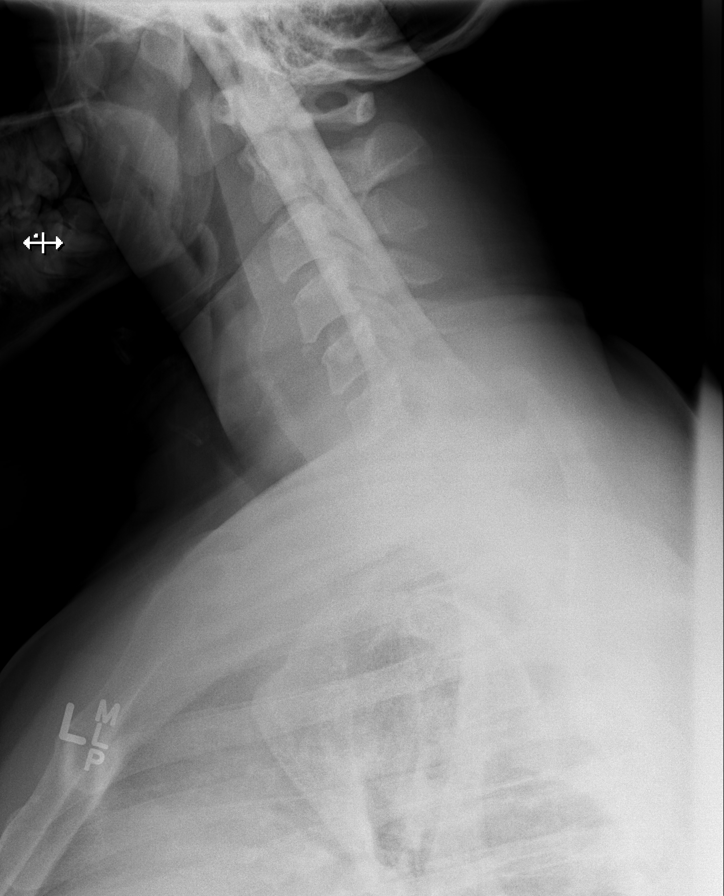

[w cervical swimmers (2 of 2)]
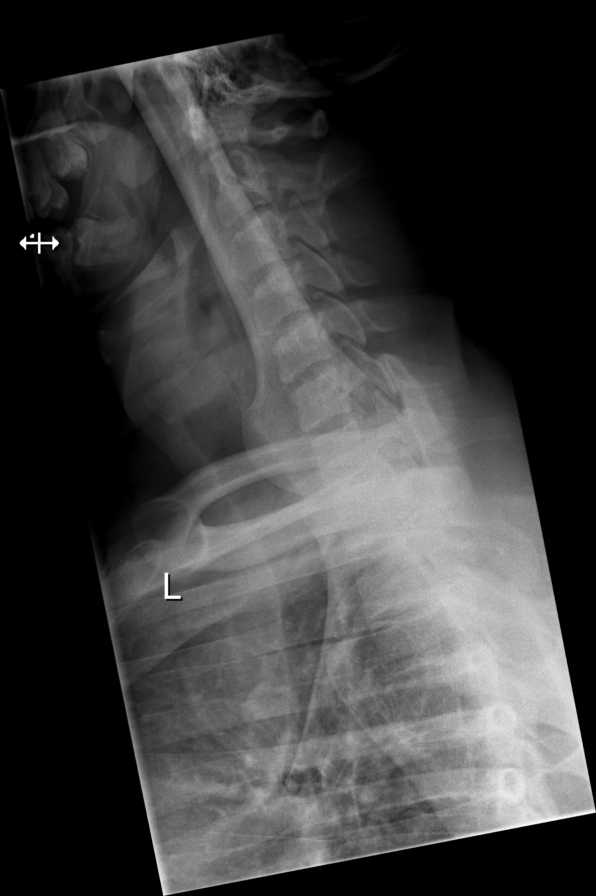

[7 of 7 positions shown; findings below may reference images not displayed]

FINDINGS: Cervical vertebral bodies and posterior elements appear intact and
aligned to the inferior endplate of C6, the most caudal well
visualized level. Straightened cervical lordosis. Intervertebral
disc heights preserved. No neural foraminal narrowing. No
destructive bony lesions. Lateral masses in alignment. Mild
prevertebral soft tissue swelling without subcutaneous gas or
radiopaque foreign bodies.
IMPRESSION: Mild prevertebral soft tissue swelling may be infectious or
inflammatory. No acute osseous process.

## 2018-06-22 ENCOUNTER — Encounter (HOSPITAL_COMMUNITY): Payer: Self-pay | Admitting: Emergency Medicine

## 2018-06-22 ENCOUNTER — Emergency Department (HOSPITAL_COMMUNITY)
Admission: EM | Admit: 2018-06-22 | Discharge: 2018-06-23 | Discharge status: Against Medical Advice | Payer: BLUE CROSS/BLUE SHIELD | Attending: Emergency Medicine | Admitting: Emergency Medicine

## 2018-06-22 DIAGNOSIS — Z5321 Procedure and treatment not carried out due to patient leaving prior to being seen by health care provider: Secondary | ICD-10-CM | POA: Insufficient documentation

## 2018-06-22 LAB — COMPREHENSIVE METABOLIC PANEL
ALBUMIN: 4.3 g/dL (ref 3.5–5.0)
ALK PHOS: 90 U/L (ref 38–126)
ALT: 15 U/L (ref 0–44)
ANION GAP: 6 (ref 5–15)
AST: 24 U/L (ref 15–41)
BUN: 8 mg/dL (ref 6–20)
CHLORIDE: 105 mmol/L (ref 98–111)
CO2: 30 mmol/L (ref 22–32)
Calcium: 9.6 mg/dL (ref 8.9–10.3)
Creatinine, Ser: 1.1 mg/dL (ref 0.61–1.24)
GFR calc non Af Amer: >60 mL/min (ref >60)
GLUCOSE: 107 mg/dL — AB (ref 70–99)
POTASSIUM: 4.1 mmol/L (ref 3.5–5.1)
SODIUM: 141 mmol/L (ref 135–145)
Total Bilirubin: 0.9 mg/dL (ref 0.3–1.2)
Total Protein: 7.7 g/dL (ref 6.5–8.1)

## 2018-06-22 LAB — CBC
HEMATOCRIT: 46.1 % (ref 39.0–52.0)
HEMOGLOBIN: 15.1 g/dL (ref 13.0–17.0)
MCH: 26.7 pg (ref 26.0–34.0)
MCHC: 32.8 g/dL (ref 30.0–36.0)
MCV: 81.6 fL (ref 80.0–100.0)
NRBC: 0 % (ref 0.0–0.2)
PLATELETS: 283 10*3/uL (ref 150–400)
RBC: 5.65 MIL/uL (ref 4.22–5.81)
RDW: 14.7 % (ref 11.5–15.5)
WBC: 11 10*3/uL — ABNORMAL HIGH (ref 4.0–10.5)

## 2018-06-22 LAB — URINALYSIS, ROUTINE W REFLEX MICROSCOPIC
Bacteria, UA: NONE SEEN
Bilirubin Urine: NEGATIVE
GLUCOSE, UA: NEGATIVE mg/dL
KETONES UR: NEGATIVE mg/dL
LEUKOCYTES UA: NEGATIVE
Nitrite: NEGATIVE
PROTEIN: NEGATIVE mg/dL
Specific Gravity, Urine: 1.015 (ref 1.005–1.030)
pH: 6 (ref 5.0–8.0)

## 2018-06-22 LAB — LIPASE, BLOOD: LIPASE: 27 U/L (ref 11–51)

## 2018-06-22 NOTE — ED Triage Notes (Signed)
Reports back pain from top of spine to bottom as well as abdominal pain in RUQ describes as a squeezing.  Reports eating okay with no n/v.  Also c/o left ear pain.  All complaints started prior to christmas.  Not taking any medications at home.

## 2018-06-23 NOTE — ED Notes (Signed)
Called for Pt to recheck vitals. No answer 

## 2018-12-30 ENCOUNTER — Emergency Department (HOSPITAL_COMMUNITY)
Admission: EM | Admit: 2018-12-30 | Discharge: 2018-12-30 | Disposition: A | Discharge status: Home / Self Care | Payer: BC Managed Care – PPO | Attending: Emergency Medicine | Admitting: Emergency Medicine

## 2018-12-30 ENCOUNTER — Other Ambulatory Visit: Payer: Self-pay

## 2018-12-30 ENCOUNTER — Encounter (HOSPITAL_COMMUNITY): Payer: Self-pay | Admitting: Emergency Medicine

## 2018-12-30 DIAGNOSIS — Z87891 Personal history of nicotine dependence: Secondary | ICD-10-CM | POA: Diagnosis not present

## 2018-12-30 DIAGNOSIS — R51 Headache: Secondary | ICD-10-CM | POA: Insufficient documentation

## 2018-12-30 DIAGNOSIS — M791 Myalgia, unspecified site: Secondary | ICD-10-CM | POA: Diagnosis not present

## 2018-12-30 DIAGNOSIS — R519 Headache, unspecified: Secondary | ICD-10-CM

## 2018-12-30 MED ORDER — DEXAMETHASONE SODIUM PHOSPHATE 10 MG/ML IJ SOLN
10.0000 mg | Freq: Once | INTRAMUSCULAR | Status: AC
  Administered 2018-12-30: 14:00:00 10 mg via INTRAMUSCULAR
  Filled 2018-12-30: qty 1

## 2018-12-30 MED ORDER — KETOROLAC TROMETHAMINE 15 MG/ML IJ SOLN
15.0000 mg | Freq: Once | INTRAMUSCULAR | Status: AC
  Administered 2018-12-30: 14:00:00 15 mg via INTRAMUSCULAR
  Filled 2018-12-30: qty 1

## 2018-12-30 NOTE — ED Provider Notes (Signed)
[facility] EMERGENCY DEPARTMENT Provider Note   CSN: [id number] Arrival date & time: 12/30/18  1216    History   Chief Complaint Chief Complaint  Patient presents with  . Headache  . Generalized Body Aches    HPI Douglas Ali is a 33 y.o. male.     33 year old male presents the emergency room with complaint of generalized headache and body aches localized to his legs.  Patient states his symptoms started yesterday.  He denies travel, sick contacts, fever, cough or congestion.  Patient took DayQuil since it contains Tylenol for his headache with some relief.  Patient reports that he has headaches periodically, last similar headache was 1 month ago.  Patient denies changes in vision, speech, gait.  No other complaints or concerns.     History reviewed. No pertinent past medical history.  There are no active problems to display for this patient.   History reviewed. No pertinent surgical history.      Home Medications    Prior to Admission medications   Medication Sig Start Date End Date Taking? Authorizing Provider  acetaminophen (TYLENOL) 500 MG tablet Take 1,500 mg by mouth every 6 (six) hours as needed for mild pain.    [provider]    Family History No family history on file.  Social History Social History   Tobacco Use  . Smoking status: Former Smoker    Packs/day: 0.50    Years: 10.00    Pack years: 5.00  . Smokeless tobacco: Never Used  . Tobacco comment: Pt stop date November 2018  Substance Use Topics  . Alcohol use: No  . Drug use: No     Allergies   Patient has no known allergies.   Review of Systems Review of Systems  Constitutional: Negative for chills and fever.  HENT: Negative for congestion.   Eyes: Negative for visual disturbance.  Respiratory: Negative for cough and shortness of breath.   Cardiovascular: Negative for chest pain.  Gastrointestinal: Negative for abdominal pain, constipation,  diarrhea, nausea and vomiting.  Musculoskeletal: Positive for myalgias. Negative for back pain, gait problem, joint swelling, neck pain and neck stiffness.  Skin: Negative for rash and wound.  Allergic/Immunologic: Negative for immunocompromised state.  Neurological: Positive for headaches. Negative for dizziness, speech difficulty, weakness, light-headedness and numbness.  Hematological: Negative for adenopathy.  Psychiatric/Behavioral: Negative for confusion.  All other systems reviewed and are negative.    Physical Exam Updated Vital Signs BP 117/83 (BP Location: Right Arm)   Pulse 92   Temp 98.4 F (36.9 C) (Oral)   Resp 20   Ht 5\' 7"  (1.702 m)   Wt 95.3 kg   SpO2 97%   BMI 32.89 kg/m   Physical Exam Vitals signs and nursing note reviewed.  Constitutional:      General: He is not in acute distress.    Appearance: He is well-developed. He is not diaphoretic.  HENT:     Head: Normocephalic and atraumatic.  Pulmonary:     Effort: Pulmonary effort is normal.  Neurological:     Mental Status: He is alert and oriented to person, place, and time.     GCS: GCS eye subscore is 4. GCS verbal subscore is 5. GCS motor subscore is 6.     Cranial Nerves: No cranial nerve deficit, dysarthria or facial asymmetry.     Gait: Gait normal.  Psychiatric:        Behavior: Behavior normal.      ED Treatments /  Results  Labs (all labs ordered are listed, but only abnormal results are displayed) Labs Reviewed - No data to display  EKG None  Radiology No results found.  Procedures Procedures (including critical care time)  Medications Ordered in ED Medications  ketorolac (TORADOL) 15 MG/ML injection 15 mg (has no administration in time range)  dexamethasone (DECADRON) injection 10 mg (has no administration in time range)     Initial Impression / Assessment and Plan / ED Course  I have reviewed the triage vital signs and the nursing notes.  Pertinent labs & imaging  results that were available during my care of the patient were reviewed by me and considered in my medical decision making (see chart for details).  Clinical Course as of Dec 29 1329  Sun Dec 30, 2018  [id number] 33 year old male with complaint of headache with aching pain in his legs since yesterday, improved with Tylenol.  Exam is unremarkable.  Patient was offered Toradol and Decadron for his headache which he accepts.  Patient requests COVID testing, does not meet current testing criteria, does not have any COVID symptoms at this point.  Advised to follow-up with PCP should he develop more symptoms.   [LM]    Clinical Course User Index [LM] Jeannie FendMurphy, Nidia Grogan A, PA-C      Final Clinical Impressions(s) / ED Diagnoses   Final diagnoses:  Nonintractable headache, unspecified chronicity pattern, unspecified headache type    ED Discharge Orders    None       Alden HippMurphy, Rimas Gilham A, PA-C 12/30/18 1331    Tilden Fossaees, Elizabeth, MD 12/30/18 [phone]

## 2018-12-30 NOTE — Discharge Instructions (Signed)
Continue with Motrin and Tylenol as needed as directed for headache and body aches.  Recheck with primary care provider if symptoms persist.

## 2018-12-30 NOTE — ED Triage Notes (Signed)
Pt here for eval of headache with body aches since Saturday. No other symptoms.

## 2018-12-30 NOTE — ED Notes (Signed)
Patient verbalizes understanding of discharge instructions. Opportunity for questioning and answers were provided. Armband removed by staff, pt discharged from ED.  

## 2018-12-30 NOTE — ED Notes (Signed)
See pa assessment 

## 2019-01-07 ENCOUNTER — Encounter (HOSPITAL_COMMUNITY): Payer: Self-pay | Admitting: Emergency Medicine

## 2019-01-07 ENCOUNTER — Emergency Department (HOSPITAL_COMMUNITY)
Admission: EM | Admit: 2019-01-07 | Discharge: 2019-01-08 | Disposition: A | Discharge status: Home / Self Care | Payer: BC Managed Care – PPO | Attending: Emergency Medicine | Admitting: Emergency Medicine

## 2019-01-07 ENCOUNTER — Emergency Department (HOSPITAL_COMMUNITY): Payer: BC Managed Care – PPO

## 2019-01-07 ENCOUNTER — Other Ambulatory Visit: Payer: Self-pay

## 2019-01-07 DIAGNOSIS — U071 COVID-19: Secondary | ICD-10-CM | POA: Insufficient documentation

## 2019-01-07 DIAGNOSIS — Z87891 Personal history of nicotine dependence: Secondary | ICD-10-CM | POA: Insufficient documentation

## 2019-01-07 DIAGNOSIS — R51 Headache: Secondary | ICD-10-CM | POA: Diagnosis not present

## 2019-01-07 DIAGNOSIS — R509 Fever, unspecified: Secondary | ICD-10-CM | POA: Diagnosis not present

## 2019-01-07 DIAGNOSIS — R0602 Shortness of breath: Secondary | ICD-10-CM | POA: Diagnosis present

## 2019-01-07 LAB — PROTIME-INR
INR: 1 (ref 0.8–1.2)
Prothrombin Time: 13.5 seconds (ref 11.4–15.2)

## 2019-01-07 LAB — URINALYSIS, ROUTINE W REFLEX MICROSCOPIC
Bilirubin Urine: NEGATIVE
Glucose, UA: NEGATIVE mg/dL
Ketones, ur: 5 mg/dL — AB
Leukocytes,Ua: NEGATIVE
Nitrite: NEGATIVE
Protein, ur: NEGATIVE mg/dL
Specific Gravity, Urine: 1.016 (ref 1.005–1.030)
pH: 5 (ref 5.0–8.0)

## 2019-01-07 LAB — CBC WITH DIFFERENTIAL/PLATELET
Abs Immature Granulocytes: 0.05 10*3/uL (ref 0.00–0.07)
Basophils Absolute: 0 10*3/uL (ref 0.0–0.1)
Basophils Relative: 0 %
Eosinophils Absolute: 0 10*3/uL (ref 0.0–0.5)
Eosinophils Relative: 0 %
HCT: 43.9 % (ref 39.0–52.0)
Hemoglobin: 14.4 g/dL (ref 13.0–17.0)
Immature Granulocytes: 1 %
Lymphocytes Relative: 21 %
Lymphs Abs: 1.1 10*3/uL (ref 0.7–4.0)
MCH: 26.4 pg (ref 26.0–34.0)
MCHC: 32.8 g/dL (ref 30.0–36.0)
MCV: 80.4 fL (ref 80.0–100.0)
Monocytes Absolute: 0.6 10*3/uL (ref 0.1–1.0)
Monocytes Relative: 12 %
Neutro Abs: 3.5 10*3/uL (ref 1.7–7.7)
Neutrophils Relative %: 66 %
Platelets: 230 10*3/uL (ref 150–400)
RBC: 5.46 MIL/uL (ref 4.22–5.81)
RDW: 13.2 % (ref 11.5–15.5)
WBC: 5.2 10*3/uL (ref 4.0–10.5)
nRBC: 0 % (ref 0.0–0.2)

## 2019-01-07 LAB — LACTIC ACID, PLASMA: Lactic Acid, Venous: 0.9 mmol/L (ref 0.5–1.9)

## 2019-01-07 LAB — APTT: aPTT: 29 seconds (ref 24–36)

## 2019-01-07 MED ORDER — SODIUM CHLORIDE 0.9 % IV BOLUS (SEPSIS): 1000.0000 mL | Freq: Once | INTRAVENOUS | Status: DC

## 2019-01-07 MED ORDER — ACETAMINOPHEN 325 MG PO TABS
650.0000 mg | ORAL_TABLET | Freq: Once | ORAL | Status: AC
  Administered 2019-01-07: 650 mg via ORAL
  Filled 2019-01-07: qty 2

## 2019-01-07 MED ORDER — SODIUM CHLORIDE 0.9 % IV BOLUS (SEPSIS)
1000.0000 mL | Freq: Once | INTRAVENOUS | Status: AC
  Administered 2019-01-07: 1000 mL via INTRAVENOUS

## 2019-01-07 NOTE — ED Provider Notes (Signed)
[facility] DEPT Provider Note   CSN: [id number] Arrival date & time: 01/07/19  2053    History   Chief Complaint Chief Complaint  Patient presents with   Shortness of Breath   Fever    HPI Douglas Ali is a 33 y.o. male who presents to the ED today complaining of gradual onset, constant, worsening, shortness of breath x 1 week. Pt reports that 1.5 weeks ago he was having headaches and body aches. He was seen in the ED at that time and given medication for his headache which improved his symptoms. He was advised to follow up with his PCP should his symptoms worsen for possible covid testing. No known exposure to covid 19 positive patients. Pt works in a [employer]. He reports those symptoms resolved but he has been feeling short of breath recently. Also endorses feelings of "hot and cold." Pt has not tested his temperature. Found to be febrile in the ED today at 102.6. Pt has no other complaints at this time including urinary symptoms, cough, headache, vision changes, neck stiffness, rash, sore throat, ear pain.        History reviewed. No pertinent past medical history.  There are no active problems to display for this patient.   History reviewed. No pertinent surgical history.      Home Medications    Prior to Admission medications   Medication Sig Start Date End Date Taking? Authorizing Provider  acetaminophen (TYLENOL) 500 MG tablet Take 1,500 mg by mouth every 6 (six) hours as needed for mild pain.    [provider]    Family History History reviewed. No pertinent family history.  Social History Social History   Tobacco Use   Smoking status: Former Smoker    Packs/day: 0.50    Years: 10.00    Pack years: 5.00   Smokeless tobacco: Never Used   Tobacco comment: Pt stop date November 2018  Substance Use Topics   Alcohol use: No   Drug use: No     Allergies   Patient has no known allergies.   Review of  Systems Review of Systems  Constitutional: Positive for chills and fever.  HENT: Negative for congestion, ear pain and sore throat.   Respiratory: Positive for shortness of breath. Negative for cough.   Cardiovascular: Negative for chest pain.  Gastrointestinal: Negative for abdominal pain, constipation, diarrhea, nausea and vomiting.  Genitourinary: Negative for dysuria and frequency.  Musculoskeletal: Negative for myalgias, neck pain and neck stiffness.  Skin: Negative for rash.  Neurological: Negative for headaches.     Physical Exam Updated Vital Signs BP 127/78 (BP Location: Left Arm)    Pulse (!) 114    Temp (!) 102.6 F (39.2 C) (Oral)    Resp 16    Ht 5\' 7"  (1.702 m)    Wt 95.3 kg    SpO2 95%    BMI 32.89 kg/m   Physical Exam Vitals signs and nursing note reviewed.  Constitutional:      Appearance: He is not ill-appearing.  HENT:     Head: Normocephalic and atraumatic.  Eyes:     Conjunctiva/sclera: Conjunctivae normal.  Neck:     Musculoskeletal: Neck supple.  Cardiovascular:     Rate and Rhythm: Regular rhythm. Tachycardia present.  Pulmonary:     Effort: Pulmonary effort is normal. No tachypnea or accessory muscle usage.     Breath sounds: Normal breath sounds. No decreased breath sounds, wheezing, rhonchi or rales.  Chest:  Chest wall: No tenderness.  Abdominal:     Palpations: Abdomen is soft.     Tenderness: There is no abdominal tenderness.  Skin:    General: Skin is warm and dry.  Neurological:     Mental Status: He is alert.      ED Treatments / Results  Labs (all labs ordered are listed, but only abnormal results are displayed) Labs Reviewed  COMPREHENSIVE METABOLIC PANEL - Abnormal; Notable for the following components:      Result Value   CO2 20 (*)    Glucose, Bld 101 (*)    Calcium 8.8 (*)    All other components within normal limits  URINALYSIS, ROUTINE W REFLEX MICROSCOPIC - Abnormal; Notable for the following components:   Color,  Urine AMBER (*)    Hgb urine dipstick SMALL (*)    Ketones, ur 5 (*)    Bacteria, UA RARE (*)    All other components within normal limits  CULTURE, BLOOD (ROUTINE X 2)  CULTURE, BLOOD (ROUTINE X 2)  URINE CULTURE  SARS CORONAVIRUS 2 (HOSPITAL ORDER, PERFORMED IN [facility])  LACTIC ACID, PLASMA  CBC WITH DIFFERENTIAL/PLATELET  APTT  PROTIME-INR  LACTIC ACID, PLASMA    EKG EKG Interpretation  Date/Time:  Monday January 07 2019 22:53:26 EDT Ventricular Rate:  95 PR Interval:    QRS Duration: 85 QT Interval:  333 QTC Calculation: 419 R Axis:   55 Text Interpretation:  Sinus rhythm Confirmed by Palumbo, April ([id number]) on 01/07/2019 11:25:19 PM   Radiology Dg Chest Port 1 View  Result Date: 01/07/2019 CLINICAL DATA:  Shortness of breath EXAM: PORTABLE CHEST 1 VIEW COMPARISON:  None. FINDINGS: Low lung volumes accentuates heart size. No confluent opacities or effusions. No acute bony abnormality. IMPRESSION: No active disease. Electronically Signed   By: Charlett NoseKevin  Dover M.D.   On: 01/07/2019 23:12    Procedures Procedures (including critical care time)  Medications Ordered in ED Medications  sodium chloride 0.9 % bolus 1,000 mL (0 mLs Intravenous Stopped 01/07/19 2329)    And  sodium chloride 0.9 % bolus 1,000 mL (0 mLs Intravenous Hold 01/07/19 2315)    And  sodium chloride 0.9 % bolus 1,000 mL (0 mLs Intravenous Hold 01/07/19 2315)  acetaminophen (TYLENOL) tablet 650 mg (650 mg Oral Given 01/07/19 2255)     Initial Impression / Assessment and Plan / ED Course  I have reviewed the triage vital signs and the nursing notes.  Pertinent labs & imaging results that were available during my care of the patient were reviewed by me and considered in my medical decision making (see chart for details).    33 year old male presenting to the ED tonight with shortness of breath x 1 week. Pt found to be febrile in the ED at 102.6; was unaware he had fevers at home but does  endorse intermittent feelings of hot and then cold. No known covid 19 positive exposures. Patient works in a [employer] and has been going to work despite symptoms. Pt also tachy on exam in the low 100's. Currently meets SIRS criteria; will work up for sepsis today although strong suspicion for covid vs other infection. Antibiotics held at this time. CXR ordered as well as baseline bloodwork. If CXR positive for pneumonia will start abx. Pt has not other infectious symptoms including headache, urinary sx, abdominal pain. Tylenol given for fever. 1 L NS bolus given initially; held off on remainder of fluids given strong suspicion for covid  vs another source of infection. If covid negative will proceed with remainder of fluids. During triage O2 sats 95% on RA; patient satting 96-97% during my exam. Speaking in full sentences without increased work of breathing. If continues to maintain above normal O2 sat may consider discharge home. If O2 sat drops below 95% patient may need to be admitted.   No leukocytosis. No electrolyte derangements. U/A clear. Lactic acid within normal limits. CXR clear for pneumonia. Still awaiting covid test.   1:07 AM At shift change case signed out to Rob Browning, PA-C, [employer] will await covid test and dispo patient accordingly. At this point in time do not believe patient is septic given normal lactic acid and no wbc count. Pt has remained in the > 95% on RA range although still tachycardic and newly tachypneic. Discharge home vs admission based on pt presentation and comfortability.         Final Clinical Impressions(s) / ED Diagnoses   Final diagnoses:  Viral illness  Shortness of breath    ED Discharge Orders    None       Tanda RockersVenter, Asheley Hellberg, PA-C 01/08/19 0107    Tegeler, Canary Brimhristopher J, MD 01/08/19 1538

## 2019-01-07 NOTE — ED Triage Notes (Signed)
Pt reports increased shortness of breath for the last several days. Fever found at triage.

## 2019-01-08 LAB — COMPREHENSIVE METABOLIC PANEL
ALT: 22 U/L (ref 0–44)
AST: 30 U/L (ref 15–41)
Albumin: 4.4 g/dL (ref 3.5–5.0)
Alkaline Phosphatase: 77 U/L (ref 38–126)
Anion gap: 13 (ref 5–15)
BUN: 12 mg/dL (ref 6–20)
CO2: 20 mmol/L — ABNORMAL LOW (ref 22–32)
Calcium: 8.8 mg/dL — ABNORMAL LOW (ref 8.9–10.3)
Chloride: 103 mmol/L (ref 98–111)
Creatinine, Ser: 1.1 mg/dL (ref 0.61–1.24)
GFR calc Af Amer: >60 mL/min (ref >60)
GFR calc non Af Amer: >60 mL/min (ref >60)
Glucose, Bld: 101 mg/dL — ABNORMAL HIGH (ref 70–99)
Potassium: 3.6 mmol/L (ref 3.5–5.1)
Sodium: 136 mmol/L (ref 135–145)
Total Bilirubin: 0.9 mg/dL (ref 0.3–1.2)
Total Protein: 8 g/dL (ref 6.5–8.1)

## 2019-01-08 LAB — LACTIC ACID, PLASMA: Lactic Acid, Venous: 0.8 mmol/L (ref 0.5–1.9)

## 2019-01-08 LAB — SARS CORONAVIRUS 2 BY RT PCR (HOSPITAL ORDER, PERFORMED IN ~~LOC~~ HOSPITAL LAB): SARS Coronavirus 2: POSITIVE — AB

## 2019-01-08 NOTE — ED Provider Notes (Signed)
Patient with fever and shortness of breath.  Signed out to me at shift change.  Code sepsis initially activated due to fever and tachycardia.  I believe patient likely has COVID.  Will hold fluids.  Doubt sepsis. Discussed with Dr. Randal Buba, who agrees, no weight based fluids or abx.  BPs are stable.  Lactate is normal.  2:06 AM Covid positive.  Patient ambulates maintaining 93% on RA.    VSS.  DC to home.  14 days of quarantine.   Montine Circle, PA-C 01/08/19 5456    Randal Buba, April, MD 01/08/19 2563

## 2019-01-09 LAB — URINE CULTURE: Culture: NO GROWTH

## 2019-01-13 LAB — CULTURE, BLOOD (ROUTINE X 2)
Culture: NO GROWTH
Culture: NO GROWTH
Special Requests: ADEQUATE
Special Requests: ADEQUATE

## 2022-11-14 ENCOUNTER — Encounter (HOSPITAL_COMMUNITY): Payer: Self-pay

## 2022-11-14 ENCOUNTER — Other Ambulatory Visit: Payer: Self-pay

## 2022-11-14 ENCOUNTER — Emergency Department (HOSPITAL_COMMUNITY)
Admission: EM | Admit: 2022-11-14 | Discharge: 2022-11-14 | Disposition: A | Discharge status: Home / Self Care | Payer: 59 | Attending: Emergency Medicine | Admitting: Emergency Medicine

## 2022-11-14 DIAGNOSIS — K047 Periapical abscess without sinus: Secondary | ICD-10-CM

## 2022-11-14 DIAGNOSIS — K0889 Other specified disorders of teeth and supporting structures: Secondary | ICD-10-CM | POA: Diagnosis present

## 2022-11-14 MED ORDER — PENICILLIN V POTASSIUM 500 MG PO TABS: 500.0000 mg | ORAL_TABLET | Freq: Four times a day (QID) | ORAL | 0 refills | Status: AC

## 2022-11-14 MED ORDER — NAPROXEN 375 MG PO TABS: 375.0000 mg | ORAL_TABLET | Freq: Two times a day (BID) | ORAL | 0 refills | Status: AC

## 2022-11-14 NOTE — ED Triage Notes (Signed)
C/o left sided dental pain with swelling x1 day.  Pt reports chipped tooth.  Denies body ache, fever, chills.

## 2022-11-14 NOTE — ED Provider Notes (Signed)
[facility] EMERGENCY DEPARTMENT AT [facility] Provider Note   CSN: [id number] Arrival date & time: 11/14/22  4098     History  Chief Complaint  Patient presents with   Dental Pain    Douglas Ali is a 37 y.o. male.   Dental Pain    Patient presents to the ED with complaints of facial swelling.  Patient states he started having a tooth ache the other day.  Has decreased but now he noticed swelling on the left cheek.  It is tender to the touch.  He is not having any fevers or chills.  No difficulty swallowing.    Home Medications Prior to Admission medications   Medication Sig Start Date End Date Taking? Authorizing Provider  naproxen (NAPROSYN) 375 MG tablet Take 1 tablet (375 mg total) by mouth 2 (two) times daily. 11/14/22  Yes Linwood Dibbles, MD  penicillin v potassium (VEETID) 500 MG tablet Take 1 tablet (500 mg total) by mouth 4 (four) times daily for 7 days. 11/14/22 11/21/22 Yes Linwood Dibbles, MD  acetaminophen (TYLENOL) 500 MG tablet Take 1,500 mg by mouth every 6 (six) hours as needed for mild pain.    [provider]      Allergies    Patient has no known allergies.    Review of Systems   Review of Systems  Physical Exam Updated Vital Signs BP 132/86 (BP Location: Left Arm)   Pulse (!) 58   Temp 98.3 F (36.8 C) (Oral)   Resp 16   Wt 95 kg   SpO2 94%   BMI 32.80 kg/m  Physical Exam Vitals and nursing note reviewed.  Constitutional:      General: He is not in acute distress.    Appearance: He is well-developed.  HENT:     Head: Normocephalic and atraumatic.     Comments: Erythema, induration edema left cheek    Right Ear: External ear normal.     Left Ear: External ear normal.     Mouth/Throat:     Dentition: Dental caries present.     Comments: Dental caries with tooth in the left upper molar region decayed to the gingiva, swelling in the cheek is adjacent to this decayed tooth Eyes:     General: No scleral icterus.       Right eye:  No discharge.        Left eye: No discharge.     Conjunctiva/sclera: Conjunctivae normal.  Neck:     Trachea: No tracheal deviation.  Cardiovascular:     Rate and Rhythm: Normal rate.  Pulmonary:     Effort: Pulmonary effort is normal. No respiratory distress.     Breath sounds: No stridor.  Abdominal:     General: There is no distension.  Musculoskeletal:        General: No swelling or deformity.     Cervical back: Neck supple.  Skin:    General: Skin is warm and dry.     Findings: No rash.  Neurological:     Mental Status: He is alert. Mental status is at baseline.     Cranial Nerves: No dysarthria or facial asymmetry.     Motor: No seizure activity.     ED Results / Procedures / Treatments   Labs (all labs ordered are listed, but only abnormal results are displayed) Labs Reviewed - No data to display  EKG None  Radiology No results found.  Procedures Procedures    Medications Ordered in ED Medications -  No data to display  ED Course/ Medical Decision Making/ A&P                             Medical Decision Making  I suspect patient's facial swelling is related to a dental abscess.  He is not having any fevers or chills no airway compromise.  Patient has not had any sinus symptoms.  Will start on antibiotics.  Refer to dentistry for outpatient follow-up and further treatment        Final Clinical Impression(s) / ED Diagnoses Final diagnoses:  Dental abscess    Rx / DC Orders ED Discharge Orders          Ordered    naproxen (NAPROSYN) 375 MG tablet  2 times daily        11/14/22 0911    penicillin v potassium (VEETID) 500 MG tablet  4 times daily        11/14/22 0911              Linwood Dibbles, MD 11/14/22 [phone]

## 2022-11-15 ENCOUNTER — Encounter (HOSPITAL_COMMUNITY): Payer: Self-pay

## 2022-11-15 ENCOUNTER — Other Ambulatory Visit: Payer: Self-pay

## 2022-11-15 ENCOUNTER — Emergency Department (HOSPITAL_COMMUNITY)
Admission: EM | Admit: 2022-11-15 | Discharge: 2022-11-15 | Disposition: A | Discharge status: Home / Self Care | Payer: 59 | Attending: Emergency Medicine | Admitting: Emergency Medicine

## 2022-11-15 DIAGNOSIS — R220 Localized swelling, mass and lump, head: Secondary | ICD-10-CM | POA: Insufficient documentation

## 2022-11-15 MED ORDER — AMOXICILLIN-POT CLAVULANATE 875-125 MG PO TABS: 1.0000 | ORAL_TABLET | Freq: Two times a day (BID) | ORAL | 0 refills | Status: AC

## 2022-11-15 NOTE — ED Triage Notes (Signed)
Pt arrives POV with a c/o of left sided facial swelling which started Saturday. PT denies dental pain.Pain is reported when touched.Left side of his lip has went numb before

## 2022-11-15 NOTE — ED Provider Notes (Signed)
EMERGENCY DEPARTMENT AT [facility] Provider Note   CSN: [id number] Arrival date & time: 11/15/22  1709     History Chief Complaint  Patient presents with   Facial Swelling    HPI Douglas Ali. is a 37 y.o. male presenting for chief complaint of left-sided facial swelling. Patient is a 37 year old male with a minimal medical history.  He is ambulatory tolerating p.o. intake in no acute distress.  States that he was seen for dental pain yesterday but has not picked up these antibiotics yet. Comes in today with worsening facial swelling and discomfort. The left of his face is diffusely tender to palpation.  He states the left part of his nose and stopped working as well.. Patient's recorded medical, surgical, social, medication list and allergies were reviewed in the Snapshot window as part of the initial history.   Review of Systems   Review of Systems  Constitutional:  Negative for chills and fever.  HENT:  Positive for facial swelling and sinus pain. Negative for ear pain and sore throat.   Eyes:  Negative for pain and visual disturbance.  Respiratory:  Negative for cough and shortness of breath.   Cardiovascular:  Negative for chest pain and palpitations.  Gastrointestinal:  Negative for abdominal pain and vomiting.  Genitourinary:  Negative for dysuria and hematuria.  Musculoskeletal:  Negative for arthralgias and back pain.  Skin:  Negative for color change and rash.  Neurological:  Negative for seizures and syncope.  All other systems reviewed and are negative.   Physical Exam Updated Vital Signs BP 126/76 (BP Location: Right Arm)   Pulse 97   Temp 99.4 F (37.4 C) (Oral)   Resp 18   SpO2 100%  Physical Exam Vitals and nursing note reviewed.  Constitutional:      General: He is not in acute distress.    Appearance: He is well-developed.  HENT:     Head: Normocephalic and atraumatic.  Eyes:     Conjunctiva/sclera: Conjunctivae  normal.  Cardiovascular:     Rate and Rhythm: Normal rate and regular rhythm.     Heart sounds: No murmur heard. Pulmonary:     Effort: Pulmonary effort is normal. No respiratory distress.     Breath sounds: Normal breath sounds.  Abdominal:     Palpations: Abdomen is soft.     Tenderness: There is no abdominal tenderness.  Musculoskeletal:        General: No swelling.     Cervical back: Neck supple.  Skin:    General: Skin is warm and dry.     Capillary Refill: Capillary refill takes less than 2 seconds.  Neurological:     Mental Status: He is alert.  Psychiatric:        Mood and Affect: Mood normal.      ED Course/ Medical Decision Making/ A&P    Procedures Procedures   Medications Ordered in ED Medications - No data to display  Medical Decision Making:   Mr. Lader is presenting with continued left-sided facial pain.  He was started on penicillin yesterday for suspected dental infection.  However dental symptoms have all resolved but now he is stuck with this left-sided facial swelling that is relatively impressive.  He is having severe congestion in his left maxillary sinus and cannot breathe on the left nose.  This been persistent over the last week but worsening today.  He denies fevers chills nausea vomiting syncope or shortness of breath.  He  is otherwise ambulatory tolerating p.o. intake.  His exam is only concerning for left maxillary sinus pressure and tenderness as well as left-sided otic effusion. Prolonged shared medical decision making conversation with patient.  He is overall well-appearing duration of symptoms reaches criteria for treatment with Augmentin for suspected maxillary sinusitis to prevent progression to preseptal cellulitis.  He does not have any clinical evidence of orbital cellulitis at this time. Will broaden patient's antibiotics and recommend close follow-up with primary care provider within 48 hours and patient expressed understanding.  Clinical  Impression:  1. Facial swelling      Data Unavailable   Final Clinical Impression(s) / ED Diagnoses Final diagnoses:  Facial swelling    Rx / DC Orders ED Discharge Orders          Ordered    amoxicillin-clavulanate (AUGMENTIN) 875-125 MG tablet  2 times daily        11/15/22 1847              Glyn Ade, MD 11/15/22 2326

## 2022-12-21 ENCOUNTER — Other Ambulatory Visit: Payer: Self-pay | Admitting: Physician Assistant

## 2022-12-21 DIAGNOSIS — S8391XA Sprain of unspecified site of right knee, initial encounter: Secondary | ICD-10-CM

## 2022-12-25 ENCOUNTER — Ambulatory Visit
Admission: RE | Admit: 2022-12-25 | Discharge: 2022-12-25 | Disposition: A | Discharge status: Home / Self Care | Payer: Worker's Compensation | Source: Ambulatory Visit | Attending: Physician Assistant | Admitting: Physician Assistant

## 2022-12-25 DIAGNOSIS — S8391XA Sprain of unspecified site of right knee, initial encounter: Secondary | ICD-10-CM

## 2023-04-04 ENCOUNTER — Encounter (HOSPITAL_COMMUNITY): Payer: Self-pay

## 2023-04-04 ENCOUNTER — Other Ambulatory Visit: Payer: Self-pay

## 2023-04-04 ENCOUNTER — Emergency Department (HOSPITAL_COMMUNITY)
Admission: EM | Admit: 2023-04-04 | Discharge: 2023-04-04 | Disposition: A | Discharge status: Home / Self Care | Payer: 59 | Attending: Emergency Medicine | Admitting: Emergency Medicine

## 2023-04-04 ENCOUNTER — Emergency Department (HOSPITAL_COMMUNITY): Payer: 59

## 2023-04-04 DIAGNOSIS — R1033 Periumbilical pain: Secondary | ICD-10-CM | POA: Insufficient documentation

## 2023-04-04 DIAGNOSIS — R109 Unspecified abdominal pain: Secondary | ICD-10-CM

## 2023-04-04 DIAGNOSIS — R1031 Right lower quadrant pain: Secondary | ICD-10-CM | POA: Insufficient documentation

## 2023-04-04 LAB — URINALYSIS, ROUTINE W REFLEX MICROSCOPIC
Bacteria, UA: NONE SEEN
Bilirubin Urine: NEGATIVE
Glucose, UA: NEGATIVE mg/dL
Ketones, ur: NEGATIVE mg/dL
Leukocytes,Ua: NEGATIVE
Nitrite: NEGATIVE
Protein, ur: NEGATIVE mg/dL
Specific Gravity, Urine: 1.015 (ref 1.005–1.030)
pH: 5 (ref 5.0–8.0)

## 2023-04-04 LAB — COMPREHENSIVE METABOLIC PANEL
ALT: 15 U/L (ref 0–44)
AST: 17 U/L (ref 15–41)
Albumin: 4.1 g/dL (ref 3.5–5.0)
Alkaline Phosphatase: 83 U/L (ref 38–126)
Anion gap: 8 (ref 5–15)
BUN: 14 mg/dL (ref 6–20)
CO2: 25 mmol/L (ref 22–32)
Calcium: 8.9 mg/dL (ref 8.9–10.3)
Chloride: 105 mmol/L (ref 98–111)
Creatinine, Ser: 0.99 mg/dL (ref 0.61–1.24)
GFR, Estimated: >60 mL/min (ref >60)
Glucose, Bld: 105 mg/dL — ABNORMAL HIGH (ref 70–99)
Potassium: 3.7 mmol/L (ref 3.5–5.1)
Sodium: 138 mmol/L (ref 135–145)
Total Bilirubin: 0.9 mg/dL (ref 0.3–1.2)
Total Protein: 7.3 g/dL (ref 6.5–8.1)

## 2023-04-04 LAB — CBC
HCT: 43.7 % (ref 39.0–52.0)
Hemoglobin: 14.7 g/dL (ref 13.0–17.0)
MCH: 27.1 pg (ref 26.0–34.0)
MCHC: 33.6 g/dL (ref 30.0–36.0)
MCV: 80.5 fL (ref 80.0–100.0)
Platelets: 230 10*3/uL (ref 150–400)
RBC: 5.43 MIL/uL (ref 4.22–5.81)
RDW: 15.4 % (ref 11.5–15.5)
WBC: 10.1 10*3/uL (ref 4.0–10.5)
nRBC: 0 % (ref 0.0–0.2)

## 2023-04-04 LAB — LIPASE, BLOOD: Lipase: 45 U/L (ref 11–51)

## 2023-04-04 MED ORDER — IOHEXOL 300 MG/ML  SOLN
100.0000 mL | Freq: Once | INTRAMUSCULAR | Status: AC | PRN
  Administered 2023-04-04: 100 mL via INTRAVENOUS

## 2023-04-04 MED ORDER — SODIUM CHLORIDE 0.9 % IV BOLUS
1000.0000 mL | Freq: Once | INTRAVENOUS | Status: AC
  Administered 2023-04-04: 1000 mL via INTRAVENOUS

## 2023-04-04 NOTE — Discharge Instructions (Signed)
As we discussed your CT scan and labs unremarkable.   See your doctor for follow up   Return to ER if you have worse abdominal pain, vomiting, fever

## 2023-04-04 NOTE — ED Triage Notes (Signed)
Patient has had abdominal pain around his belly button for 2 weeks. No vomiting or diarrhea. Stated the pain comes and goes.

## 2023-04-04 NOTE — ED Provider Notes (Signed)
[facility] AT [facility] Provider Note   CSN: [id number] Arrival date & time: 04/04/23  1145     History  Chief Complaint  Patient presents with   Abdominal Pain    Douglas Joung. is a 37 y.o. male otherwise healthy here presenting with periumbilical pain.  Patient states that he traveled to [place] last week.  He has been having periumbilical pain for the last week or so.  He states that it radiates to the right lower quadrant.  Patient denies any previous abdominal surgeries.  Denies any urinary symptoms.  The history is provided by the patient.       Home Medications Prior to Admission medications   Medication Sig Start Date End Date Taking? Authorizing Provider  acetaminophen (TYLENOL) 500 MG tablet Take 1,500 mg by mouth every 6 (six) hours as needed for mild pain.    [provider]  amoxicillin-clavulanate (AUGMENTIN) 875-125 MG tablet Take 1 tablet by mouth 2 (two) times daily. 11/15/22   Glyn Ade, MD  naproxen (NAPROSYN) 375 MG tablet Take 1 tablet (375 mg total) by mouth 2 (two) times daily. 11/14/22   Linwood Dibbles, MD      Allergies    Patient has no known allergies.    Review of Systems   Review of Systems  Gastrointestinal:  Positive for abdominal pain.  All other systems reviewed and are negative.   Physical Exam Updated Vital Signs BP (!) 155/97   Pulse 72   Temp 98 F (36.7 C)   Resp 16   Ht 5\' 7"  (1.702 m)   Wt 99.8 kg   SpO2 100%   BMI 34.46 kg/m  Physical Exam Vitals and nursing note reviewed.  Constitutional:      Appearance: He is well-developed.  HENT:     Head: Normocephalic.     Mouth/Throat:     Mouth: Mucous membranes are moist.     Pharynx: Oropharynx is clear.  Eyes:     Extraocular Movements: Extraocular movements intact.     Pupils: Pupils are equal, round, and reactive to light.  Cardiovascular:     Rate and Rhythm: Normal rate and regular rhythm.  Pulmonary:      Effort: Pulmonary effort is normal.     Breath sounds: Normal breath sounds.  Abdominal:     General: Abdomen is flat.     Comments: Mild periumbilical tenderness, mild RLQ tenderness   Skin:    Capillary Refill: Capillary refill takes less than 2 seconds.  Neurological:     General: No focal deficit present.     Mental Status: He is alert.  Psychiatric:        Mood and Affect: Mood normal.        Behavior: Behavior normal.     ED Results / Procedures / Treatments   Labs (all labs ordered are listed, but only abnormal results are displayed) Labs Reviewed  COMPREHENSIVE METABOLIC PANEL - Abnormal; Notable for the following components:      Result Value   Glucose, Bld 105 (*)    All other components within normal limits  LIPASE, BLOOD  CBC  URINALYSIS, ROUTINE W REFLEX MICROSCOPIC    EKG None  Radiology No results found.  Procedures Procedures    Medications Ordered in ED Medications  sodium chloride 0.9 % bolus 1,000 mL (1,000 mLs Intravenous New Bag/Given 04/04/23 1743)    ED Course/ Medical Decision Making/ A&P  Medical Decision Making Douglas Suess. is a 37 y.o. male here presenting with periumbilical pain and right lower quadrant pain.  Concern for possible appendicitis versus musculoskeletal pain.  Plan to get CBC and CMP and CT abdomen pelvis.  7:30 PM I reviewed patient's labs labs unremarkable and CT abdomen unremarkable. Stable for discharge  Problems Addressed: Abdominal cramps: acute illness or injury  Amount and/or Complexity of Data Reviewed Labs: ordered. Decision-making details documented in ED Course. Radiology: ordered and independent interpretation performed. Decision-making details documented in ED Course.  Risk Prescription drug management.    Final Clinical Impression(s) / ED Diagnoses Final diagnoses:  None    Rx / DC Orders ED Discharge Orders     None         Charlynne Pander,  MD 04/04/23 1930

## 2023-04-04 NOTE — ED Provider Triage Note (Signed)
Emergency Medicine Provider Triage Evaluation Note  Douglas Ali , a 37 y.o. male  was evaluated in triage.  Pt complains of abdominal pain around the navel.  He states pain will come and go.  Denies nausea, vomiting, diarrhea, fever.  LBM 2 days prior and reportedly normal.  Review of Systems  Positive: As above Negative: As above  Physical Exam  BP 119/83   Pulse (!) 53   Temp 98.4 F (36.9 C) (Oral)   Resp 18   Ht 5\' 7"  (1.702 m)   Wt 99.8 kg   SpO2 100%   BMI 34.46 kg/m  Gen:   Awake, no distress   Resp:  Normal effort  MSK:   Moves extremities without difficulty  Other:    Medical Decision Making  Medically screening exam initiated at 12:34 PM.  Appropriate orders placed.  Douglas Ali. was informed that the remainder of the evaluation will be completed by another provider, this initial triage assessment does not replace that evaluation, and the importance of remaining in the ED until their evaluation is complete.     Melton Alar R, PA-C 04/04/23 1234
